# Patient Record
Sex: Female | Born: 1960
Health system: Southern US, Community
[De-identification: ages and names within clinical notes are randomized; demographics above are authoritative.]

## PROBLEM LIST (undated history)

## (undated) DIAGNOSIS — T8859XA Other complications of anesthesia, initial encounter: Secondary | ICD-10-CM

## (undated) DIAGNOSIS — R011 Cardiac murmur, unspecified: Secondary | ICD-10-CM

## (undated) DIAGNOSIS — G43909 Migraine, unspecified, not intractable, without status migrainosus: Secondary | ICD-10-CM

## (undated) DIAGNOSIS — T4145XA Adverse effect of unspecified anesthetic, initial encounter: Secondary | ICD-10-CM

## (undated) DIAGNOSIS — N135 Crossing vessel and stricture of ureter without hydronephrosis: Secondary | ICD-10-CM

## (undated) DIAGNOSIS — B009 Herpesviral infection, unspecified: Secondary | ICD-10-CM

## (undated) DIAGNOSIS — N879 Dysplasia of cervix uteri, unspecified: Secondary | ICD-10-CM

## (undated) DIAGNOSIS — J45909 Unspecified asthma, uncomplicated: Secondary | ICD-10-CM

## (undated) DIAGNOSIS — R112 Nausea with vomiting, unspecified: Secondary | ICD-10-CM

## (undated) DIAGNOSIS — Z9889 Other specified postprocedural states: Secondary | ICD-10-CM

## (undated) DIAGNOSIS — M8430XA Stress fracture, unspecified site, initial encounter for fracture: Secondary | ICD-10-CM

## (undated) HISTORY — DX: Unspecified asthma, uncomplicated: J45.909

## (undated) HISTORY — DX: Dysplasia of cervix uteri, unspecified: N87.9

## (undated) HISTORY — DX: Herpesviral infection, unspecified: B00.9

## (undated) HISTORY — DX: Crossing vessel and stricture of ureter without hydronephrosis: N13.5

## (undated) HISTORY — PX: OTHER SURGICAL HISTORY: SHX169

## (undated) HISTORY — DX: Stress fracture, unspecified site, initial encounter for fracture: M84.30XA

## (undated) HISTORY — DX: Migraine, unspecified, not intractable, without status migrainosus: G43.909

---

## 1989-08-02 HISTORY — PX: TMJ ARTHROSCOPY: SHX1067

## 1995-08-03 DIAGNOSIS — N879 Dysplasia of cervix uteri, unspecified: Secondary | ICD-10-CM

## 1995-08-03 HISTORY — PX: CERVICAL BIOPSY  W/ LOOP ELECTRODE EXCISION: SUR135

## 1995-08-03 HISTORY — DX: Dysplasia of cervix uteri, unspecified: N87.9

## 1999-12-07 ENCOUNTER — Other Ambulatory Visit: Admission: RE | Admit: 1999-12-07 | Discharge: 1999-12-07 | Payer: Self-pay | Admitting: Obstetrics and Gynecology

## 2000-08-01 ENCOUNTER — Ambulatory Visit (HOSPITAL_COMMUNITY): Admission: RE | Admit: 2000-08-01 | Discharge: 2000-08-01 | Payer: Self-pay | Admitting: Family Medicine

## 2000-08-01 ENCOUNTER — Encounter: Payer: Self-pay | Admitting: Family Medicine

## 2002-12-04 ENCOUNTER — Other Ambulatory Visit: Admission: RE | Admit: 2002-12-04 | Discharge: 2002-12-04 | Payer: Self-pay | Admitting: Obstetrics and Gynecology

## 2003-12-11 ENCOUNTER — Other Ambulatory Visit: Admission: RE | Admit: 2003-12-11 | Discharge: 2003-12-11 | Payer: Self-pay | Admitting: Obstetrics and Gynecology

## 2005-01-05 ENCOUNTER — Other Ambulatory Visit: Admission: RE | Admit: 2005-01-05 | Discharge: 2005-01-05 | Payer: Self-pay | Admitting: Obstetrics and Gynecology

## 2006-01-26 ENCOUNTER — Other Ambulatory Visit: Admission: RE | Admit: 2006-01-26 | Discharge: 2006-01-26 | Payer: Self-pay | Admitting: Obstetrics and Gynecology

## 2007-01-31 ENCOUNTER — Other Ambulatory Visit: Admission: RE | Admit: 2007-01-31 | Discharge: 2007-01-31 | Payer: Self-pay | Admitting: Obstetrics and Gynecology

## 2008-02-05 ENCOUNTER — Other Ambulatory Visit: Admission: RE | Admit: 2008-02-05 | Discharge: 2008-02-05 | Payer: Self-pay | Admitting: Gynecology

## 2009-02-20 ENCOUNTER — Ambulatory Visit: Payer: Self-pay | Admitting: Gynecology

## 2009-02-20 ENCOUNTER — Other Ambulatory Visit: Admission: RE | Admit: 2009-02-20 | Discharge: 2009-02-20 | Payer: Self-pay | Admitting: Gynecology

## 2009-02-20 ENCOUNTER — Encounter: Payer: Self-pay | Admitting: Gynecology

## 2009-03-05 ENCOUNTER — Ambulatory Visit: Payer: Self-pay | Admitting: Sports Medicine

## 2009-05-20 ENCOUNTER — Ambulatory Visit: Payer: Self-pay | Admitting: Gynecology

## 2009-06-18 ENCOUNTER — Ambulatory Visit: Payer: Self-pay | Admitting: Gynecology

## 2009-09-09 ENCOUNTER — Other Ambulatory Visit: Admission: RE | Admit: 2009-09-09 | Discharge: 2009-09-09 | Payer: Self-pay | Admitting: Gynecology

## 2009-09-09 ENCOUNTER — Ambulatory Visit: Payer: Self-pay | Admitting: Gynecology

## 2009-10-24 ENCOUNTER — Ambulatory Visit: Payer: Self-pay | Admitting: Gynecology

## 2009-10-27 ENCOUNTER — Ambulatory Visit: Payer: Self-pay | Admitting: Gynecology

## 2009-11-18 ENCOUNTER — Encounter: Admission: RE | Admit: 2009-11-18 | Discharge: 2009-11-18 | Payer: Self-pay | Admitting: Gastroenterology

## 2010-03-06 ENCOUNTER — Ambulatory Visit: Payer: Self-pay | Admitting: Gynecology

## 2010-03-06 ENCOUNTER — Other Ambulatory Visit: Admission: RE | Admit: 2010-03-06 | Discharge: 2010-03-06 | Payer: Self-pay | Admitting: Gynecology

## 2010-05-20 ENCOUNTER — Ambulatory Visit: Payer: Self-pay | Admitting: Sports Medicine

## 2010-05-20 DIAGNOSIS — S86819A Strain of other muscle(s) and tendon(s) at lower leg level, unspecified leg, initial encounter: Secondary | ICD-10-CM

## 2010-05-20 DIAGNOSIS — S838X9A Sprain of other specified parts of unspecified knee, initial encounter: Secondary | ICD-10-CM | POA: Insufficient documentation

## 2010-09-03 NOTE — Assessment & Plan Note (Signed)
Summary: KNEE PAIN,MC   Vital Signs:  Patient profile:   50 year old female Height:      64 inches Weight:      130 pounds BMI:     22.40 BP sitting:   139 / 75  Vitals Entered By: Lillia Pauls CMA (May 20, 2010 3:29 PM)  History of Present Illness: 50 yo F runner R knee pain, posterior thigh, buttock.  Gradually worsened over last 6 weeks. located on lateral posterior knee and runs up behind.  Occurs during run or the next day.  Icing and doing ibuprofen.  Usually runs 4 miles per session, now doing more 3-3.5 miles 2/2 pain.  Never swells.  maybe feels some weakness/instability, no catching/locking.  NKI.  Running about 15 miles per week. Uses green sports insoles. Feels getting better over last 2 weeks.  Physical Exam  General:  Well-developed,well-nourished,in no acute distress; alert,appropriate and cooperative throughout examination Msk:  R knee: no effusion, ligaments intact, neg McMurray's.  Mild ttp over pes anserine bursa.  Mild ttp over biceps femoris tendon and SM/ST hamstring tendons. Nl HS strength, no palpable defect in HS. No ttp over sciatic nerve  Hips: nl strength with abd/add, ER/IR   Impression & Recommendations:  Problem # 1:  MUSCLE STRAIN, HAMSTRING MUSCLE (ICD-844.8) Assessment Improved Really more strain of the tendons than the muscle, and appears to be improving - reassured pt no other bad path found in her knee - given handout on HS rehab exercises - gradually increase back her running - f/u prn   Orders Added: 1)  Est. Patient Level IV [16109]

## 2011-03-08 ENCOUNTER — Encounter: Payer: Self-pay | Admitting: Gynecology

## 2011-03-18 ENCOUNTER — Encounter: Payer: Self-pay | Admitting: Gynecology

## 2011-03-18 ENCOUNTER — Ambulatory Visit (INDEPENDENT_AMBULATORY_CARE_PROVIDER_SITE_OTHER): Payer: BC Managed Care – PPO | Admitting: Gynecology

## 2011-03-18 ENCOUNTER — Other Ambulatory Visit (HOSPITAL_COMMUNITY)
Admission: RE | Admit: 2011-03-18 | Discharge: 2011-03-18 | Disposition: A | Discharge status: Home / Self Care | Payer: BC Managed Care – PPO | Source: Ambulatory Visit | Attending: Gynecology | Admitting: Gynecology

## 2011-03-18 VITALS — BP 116/70 | Ht 64.0 in | Wt 138.0 lb

## 2011-03-18 DIAGNOSIS — Z131 Encounter for screening for diabetes mellitus: Secondary | ICD-10-CM

## 2011-03-18 DIAGNOSIS — Z01419 Encounter for gynecological examination (general) (routine) without abnormal findings: Secondary | ICD-10-CM | POA: Insufficient documentation

## 2011-03-18 DIAGNOSIS — R011 Cardiac murmur, unspecified: Secondary | ICD-10-CM

## 2011-03-18 DIAGNOSIS — G43909 Migraine, unspecified, not intractable, without status migrainosus: Secondary | ICD-10-CM | POA: Insufficient documentation

## 2011-03-18 DIAGNOSIS — Z13220 Encounter for screening for lipoid disorders: Secondary | ICD-10-CM

## 2011-03-18 NOTE — Progress Notes (Addendum)
Anne Mann 1960-11-09 [id number]        50 y.o.  for annual exam.  Has Mirena IUD with scant menses.  Past medical history,surgical history, allergies, family history and social history were all reviewed and documented in the EPIC chart. ROS:  Was performed and pertinent positives and negatives are included in the history.  Exam: chaperone present Filed Vitals:   03/18/11 1439  BP: 116/70   General appearance  Normal Skin grossly normal Head/Neck normal with no cervical or supraclavicular adenopathy thyroid normal Lungs  clear Cardiac RR, with a 1/6 systolic ejection murmur, no rubs or gallops Abdominal  soft, nontender, without masses, organomegaly or hernia Breasts  examined lying and sitting without masses, retractions, discharge or axillary adenopathy. Pelvic  Ext/BUS/vagina  normal   Cervix  normal  Pap done IUD string visualized  Uterus  anteverted, normal size, shape and contour, midline and mobile nontender   Adnexa  Without masses or tenderness    Anus and perineum  normal   Rectovaginal  normal sphincter tone without palpated masses or tenderness.    Assessment/Plan:  50 y.o. female for annual exam.    #1 Heart murmur. Patient has subtle heart murmur. I never appreciated this before. She notes as a child being told that she had a murmur. Recommended that she see cardiology let them listen, possible echo at their discretion and she agrees with this and we'll make these arrangements. #2 IUD. Patient has Mirena IUD placed October 2010 good through 2015. She's doing well with this. IUD string visualized today. #3 History HSV. Patient stopped suppressive therapy last year has not had any outbreaks since then. We'll continue to monitor and if needed treat intermittently. #4 Health maintenance self breast exams on a monthly basis discussed encouraged. Has mammo-due in October and I reminded her to schedule this. She had a colonoscopy last year due to some irritable bowel type  symptoms and was told was normal.   [name] MD, 3:13 PM 03/18/2011

## 2011-09-06 ENCOUNTER — Encounter: Payer: Self-pay | Admitting: Gynecology

## 2012-02-09 ENCOUNTER — Ambulatory Visit
Admission: RE | Admit: 2012-02-09 | Discharge: 2012-02-09 | Disposition: A | Discharge status: Home / Self Care | Payer: BC Managed Care – PPO | Source: Ambulatory Visit | Attending: Sports Medicine | Admitting: Sports Medicine

## 2012-02-09 ENCOUNTER — Ambulatory Visit (INDEPENDENT_AMBULATORY_CARE_PROVIDER_SITE_OTHER): Payer: BC Managed Care – PPO | Admitting: Sports Medicine

## 2012-02-09 VITALS — BP 120/70 | Ht 64.0 in | Wt 130.0 lb

## 2012-02-09 DIAGNOSIS — M79669 Pain in unspecified lower leg: Secondary | ICD-10-CM

## 2012-02-09 DIAGNOSIS — M79609 Pain in unspecified limb: Secondary | ICD-10-CM

## 2012-02-09 NOTE — Addendum Note (Signed)
Addended by: Annita Brod on: 02/09/2012 02:54 PM   Modules accepted: Orders

## 2012-02-09 NOTE — Progress Notes (Signed)
  Subjective:    Patient ID: Anne Mann, female    DOB: 10-15-1960, 51 y.o.   MRN: [id number]  HPI Pt that comes today to [facility] clinic for the first time. She is a current runner 12-15 mpw that complains about pain on her right shin for about a year. The pain is located on 1/3 proximal portion on the lateral side of tibia. Pain is described as nagging and radiates to mid aspect of tibia. Pain is present when running and at rest, and more noticeable at night. Denies tingling, numbness or weakness of right LE. She has taken ibuprofen and ice during exacerbations with no absolute relieve of symptoms.   Review of Systems Per HPI    Objective:   Physical Exam Right lower extremity: Tender to palpation along the proximal tibia most pronounced along the medial tibial border. Mildly positive hop test. Slight tenderness with percussion directly over the tibia. No soft tissue swelling. Compartments are soft. Is elevation of the knee shows full motion without an effusion. She is neurovascular intact distally and walking without a limp.  Muscle skeletal ultrasound: Ultrasound evaluation of the right lower leg was performed. Images were obtained both in the longitudinal and transverse views. There is a suggestion of cortical irregularity in the proximal tibia with increased neovascularization concerning for possible stress fracture, but this is certainly not definitive.   Assessment & Plan:  Right lower leg pain-rule out proximal tibial stress fracture  Two-view x-ray of the right tib-fib were performed. No obvious stress fracture. Given the chronicity of her symptoms and findings on ultrasound I think we should pursue an MRI scan for definitive diagnosis. I will call the patient was I reviewed those results at which point we'll delineate further treatment.

## 2012-02-11 ENCOUNTER — Ambulatory Visit
Admission: RE | Admit: 2012-02-11 | Discharge: 2012-02-11 | Disposition: A | Discharge status: Home / Self Care | Payer: BC Managed Care – PPO | Source: Ambulatory Visit | Attending: Sports Medicine | Admitting: Sports Medicine

## 2012-02-11 DIAGNOSIS — M79669 Pain in unspecified lower leg: Secondary | ICD-10-CM

## 2012-02-28 ENCOUNTER — Ambulatory Visit (INDEPENDENT_AMBULATORY_CARE_PROVIDER_SITE_OTHER): Payer: BC Managed Care – PPO | Admitting: Sports Medicine

## 2012-02-28 VITALS — BP 111/72

## 2012-02-28 DIAGNOSIS — M84369A Stress fracture, unspecified tibia and fibula, initial encounter for fracture: Secondary | ICD-10-CM

## 2012-02-28 NOTE — Progress Notes (Signed)
  Subjective:    Patient ID: Anne Mann, female    DOB: [birth year], 51 y.o.   MRN: [id number]  HPI chief complaint: Followup on right shin pain  Patient comes in today for followup. Recent MRI scan of her right lower extremity showed no evidence of stress fracture. She has a mild focal area of a possible stress reaction but no discrete cortical fracture seen. Symptoms have been present intermittently for 12 month. Pain is most noticeable at the end of her run. Minimal pain with walking.    Review of Systems     Objective:   Physical Exam  Well-developed, well-nourished. No acute distress Right lower extremity still shows tenderness to palpation along the proximal to mid medial tibial border. No soft tissue swell. She is neurovascular intact distally Evaluation of her feet show well-preserved transverse and longitudinal archs. Evaluation of her running gait shows squinting patella. She has marked weakness with resisted hip abduction.  MRI is as above     Assessment & Plan:  1. Chronic right lower leg pain secondary to mild tibial stress reaction  Were going to try a body helix Sleeve for compression while running. She is shown a complete set of hip strengthening exercise and will followup in 4 weeks. I think she can continue running using pain as her guide. Call questions or concerns prior to her followup visit.

## 2012-02-28 NOTE — Progress Notes (Deleted)
  Subjective:    Patient ID: Anne Mann, female    DOB: 1961/03/07, 51 y.o.   MRN: [id number]  HPI chief complaint: Followup on right arch pain  Patient comes in today for followup. She is 95% improved with sports insoles and navicular pads. Still not doing line drills as she has just really started running again. Topical anti-inflammatory seems to be quite helpful. She will be returning to [place] state in two-weeks. She is here today with her mom.    Review of Systems     Objective:   Physical Exam  Well-developed, well-nourished. No acute distress Right foot shows no tenderness to palpation, including along the posterior tibialis tendon. No soft tissue swelling. She is walking without a limp      Assessment & Plan:  #1. Improved right foot pain secondary to mild posterior tibialis tendinitis  Patient is shown eccentric heel drops focusing on rehabilitation of the posterior tibialis tendon. She can begin to increase her mileage with her sports insoles and place. Continue with topical anti-inflammatory as needed. She may at some point need custom orthotics but for now she is happy with the sports insoles. Followup when necessary.

## 2012-03-20 ENCOUNTER — Encounter: Payer: Self-pay | Admitting: Gynecology

## 2012-03-20 ENCOUNTER — Ambulatory Visit (INDEPENDENT_AMBULATORY_CARE_PROVIDER_SITE_OTHER): Payer: BC Managed Care – PPO | Admitting: Gynecology

## 2012-03-20 ENCOUNTER — Other Ambulatory Visit (HOSPITAL_COMMUNITY)
Admission: RE | Admit: 2012-03-20 | Discharge: 2012-03-20 | Disposition: A | Discharge status: Home / Self Care | Payer: BC Managed Care – PPO | Source: Ambulatory Visit | Attending: Gynecology | Admitting: Gynecology

## 2012-03-20 VITALS — BP 112/60 | Ht 64.0 in | Wt 136.0 lb

## 2012-03-20 DIAGNOSIS — Z30431 Encounter for routine checking of intrauterine contraceptive device: Secondary | ICD-10-CM

## 2012-03-20 DIAGNOSIS — Z01419 Encounter for gynecological examination (general) (routine) without abnormal findings: Secondary | ICD-10-CM

## 2012-03-20 DIAGNOSIS — Z13220 Encounter for screening for lipoid disorders: Secondary | ICD-10-CM

## 2012-03-20 DIAGNOSIS — Z131 Encounter for screening for diabetes mellitus: Secondary | ICD-10-CM

## 2012-03-20 DIAGNOSIS — Z1151 Encounter for screening for human papillomavirus (HPV): Secondary | ICD-10-CM | POA: Insufficient documentation

## 2012-03-20 DIAGNOSIS — N951 Menopausal and female climacteric states: Secondary | ICD-10-CM

## 2012-03-20 LAB — CBC WITH DIFFERENTIAL/PLATELET
Basophils Absolute: 0 10*3/uL (ref 0.0–0.1)
Lymphocytes Relative: 26 % (ref 12–46)
Lymphs Abs: 2.4 10*3/uL (ref 0.7–4.0)
Neutro Abs: 5.7 10*3/uL (ref 1.7–7.7)
Neutrophils Relative %: 64 % (ref 43–77)
Platelets: 218 10*3/uL (ref 150–400)
RBC: 4.51 MIL/uL (ref 3.87–5.11)
WBC: 9.1 10*3/uL (ref 4.0–10.5)

## 2012-03-20 LAB — LIPID PANEL
Cholesterol: 171 mg/dL (ref 0–200)
Triglycerides: 118 mg/dL (ref <150)

## 2012-03-20 LAB — FOLLICLE STIMULATING HORMONE: FSH: 96.6 m[IU]/mL

## 2012-03-20 NOTE — Patient Instructions (Signed)
Follow up for lab work. Follow up in one year for annual gynecologic exam.

## 2012-03-20 NOTE — Addendum Note (Signed)
Addended by: Dayna Barker on: 03/20/2012 04:43 PM   Modules accepted: Orders

## 2012-03-20 NOTE — Progress Notes (Signed)
Anne Mann [birth year] [id number]        51 y.o.  [id number] for annual exam.  Several issues noted below.  Past medical history,surgical history, medications, allergies, family history and social history were all reviewed and documented in the EPIC chart. ROS:  Was performed and pertinent positives and negatives are included in the history.  Exam: [name] assistant Filed Vitals:   03/20/12 1559  BP: 112/60  Height: 5\' 4"  (1.626 m)  Weight: 136 lb (61.689 kg)   General appearance  Normal Skin grossly normal Head/Neck normal with no cervical or supraclavicular adenopathy thyroid normal Lungs  clear Cardiac RR, without RMG Abdominal  soft, nontender, without masses, organomegaly or hernia Breasts  examined lying and sitting without masses, retractions, discharge or axillary adenopathy. Pelvic  Ext/BUS/vagina  normal   Cervix  normal IUD string visualized, Pap/HPV  Uterus  axial, normal size, shape and contour, midline and mobile nontender   Adnexa  Without masses or tenderness    Anus and perineum  normal   Rectovaginal  normal sphincter tone without palpated masses or tenderness.    Assessment/Plan:  51 y.o. [id number] female for annual exam.   1. Mirena IUD.  Placed 05/2009. Doing well with scant to absent menses. We'll continue to monitor. 2. Hot flushes. Patient does note hot flushes over the past year coming and going. Lasting 4 minutes. Will check FSH. Mother does have history of premature menopause in the early 91s. 3. Mammogram.  Patient had mammogram 09/2011. We'll continue with annual mammography. SBE monthly reviewed. 4. Pap smear. History of dysplasia status post LEEP 1997 showing focus of mild dysplasia. Pap smears have been normal since then. Last Pap smear 2011. Pap/HPV done today. If negative plan less frequent screening. 5. Health maintenance. Baseline CBC lipid profile glucose urinalysis ordered along with FSH. Follow up one year, sooner as needed.    [name]  MD, 4:33 PM 03/20/2012

## 2012-03-21 ENCOUNTER — Encounter: Payer: Self-pay | Admitting: Gynecology

## 2012-03-21 LAB — URINALYSIS W MICROSCOPIC + REFLEX CULTURE
Bacteria, UA: NONE SEEN
Bilirubin Urine: NEGATIVE
Casts: NONE SEEN
Crystals: NONE SEEN
Ketones, ur: NEGATIVE mg/dL
Nitrite: NEGATIVE
Specific Gravity, Urine: <1.005 — ABNORMAL LOW (ref 1.005–1.030)
Squamous Epithelial / LPF: NONE SEEN
pH: 6.5 (ref 5.0–8.0)

## 2012-03-27 ENCOUNTER — Ambulatory Visit (INDEPENDENT_AMBULATORY_CARE_PROVIDER_SITE_OTHER): Payer: BC Managed Care – PPO | Admitting: Sports Medicine

## 2012-03-27 VITALS — BP 121/75 | Ht 64.0 in | Wt 130.0 lb

## 2012-03-27 DIAGNOSIS — IMO0002 Reserved for concepts with insufficient information to code with codable children: Secondary | ICD-10-CM

## 2012-03-27 DIAGNOSIS — S86899A Other injury of other muscle(s) and tendon(s) at lower leg level, unspecified leg, initial encounter: Secondary | ICD-10-CM

## 2012-03-27 NOTE — Progress Notes (Signed)
  Subjective:    Patient ID: Anne Mann, female    DOB: 1961-07-24, 51 y.o.   MRN: [id number]  HPI patient comes in today for followup on right lower leg pain. Overall pain has improved. She has not been running for about a week do to a URI. Recent MRI scan showed small focal area of edema in the proximal tibia consistent with a mild stress reaction but no stress fracture. She has noted benefit from her compression sleeve. She has also been compliant with her home exercises in an effort to strengthen her hips. She also has a pair of green sports insoles which she states are quite comfortable.    Review of Systems     Objective:   Physical Exam Well-developed, well-nourished. No acute distress. Awake alert and oriented x3  Right lower leg: Still a slight tenderness to palpation along the proximal medial tibial border. No soft tissue swelling. No pain to percussion over the tibia. She has much better strength with resisted hip abduction than on her previous visit, but still has some weakness. Neurovascularly intact distally. Walks without a limp.       Assessment & Plan:  1. Improving right lower leg pain secondary to medial tibial stress syndrome   Patient has made good improvement with hip abductor strengthening. We will add heel walk and toe walks to her home exercise program. She will continue with her sports insoles and compression sleeve with running. I recommended that she try to run some on a soft surface such as a soft track or on a treadmill (she normally runs on the road). She can increase activity as tolerated and followup when necessary.

## 2012-04-11 ENCOUNTER — Encounter: Payer: Self-pay | Admitting: Gynecology

## 2012-04-11 ENCOUNTER — Ambulatory Visit (INDEPENDENT_AMBULATORY_CARE_PROVIDER_SITE_OTHER): Payer: BC Managed Care – PPO | Admitting: Gynecology

## 2012-04-11 DIAGNOSIS — N951 Menopausal and female climacteric states: Secondary | ICD-10-CM

## 2012-04-11 DIAGNOSIS — Z79890 Hormone replacement therapy (postmenopausal): Secondary | ICD-10-CM

## 2012-04-11 MED ORDER — ESTRADIOL 0.05 MG/24HR TD PTTW: 1.0000 | MEDICATED_PATCH | TRANSDERMAL | Status: DC

## 2012-04-11 NOTE — Progress Notes (Signed)
Patient presents to discuss elevated FSH of 96. She is having symptoms to include hot flashes night sweats some sleep disturbance. Has Mirena IUD in place. Amenorrheic. I reviewed options to include observation, nonhormonal such as soy based, nonhormonal pharmacologic such as Effexor and HRT. I discussed the WHI study with increased risks of stroke heart attack DVT and breast cancer. I discussed the first pass effect and benefits of transdermal over oral. I reviewed the ACOG and NAMS statements of lowest dose for shortest period of time. After a lengthy discussion she wants to try HRT. I reviewed the issue of progesterone protection of the uterus against unopposed estrogen. She does have a Mirena IUD and the offbrand issues of protection with this reviewed. Options of oral progesterone versus using the Mirena IUD was discussed and we both agree on Mirena IUD at present. Will start with MiniVivelle 0.05 patches 2 weeks sample refill x1 year. Report any bleeding.  Initiate some relief but not totally sure call and I may increase her to 0.075. Otherwise if she does well then she'll continue through next year. She has any issues or questions she will call me.

## 2012-04-11 NOTE — Patient Instructions (Signed)

## 2012-09-11 ENCOUNTER — Encounter: Payer: Self-pay | Admitting: Gynecology

## 2012-09-12 ENCOUNTER — Other Ambulatory Visit: Payer: Self-pay | Admitting: *Deleted

## 2012-09-12 DIAGNOSIS — R928 Other abnormal and inconclusive findings on diagnostic imaging of breast: Secondary | ICD-10-CM

## 2012-09-13 ENCOUNTER — Other Ambulatory Visit: Payer: Self-pay | Admitting: Gynecology

## 2012-09-13 DIAGNOSIS — R928 Other abnormal and inconclusive findings on diagnostic imaging of breast: Secondary | ICD-10-CM

## 2012-11-29 ENCOUNTER — Telehealth: Payer: Self-pay | Admitting: *Deleted

## 2012-11-29 MED ORDER — ESTRADIOL 0.075 MG/24HR TD PTTW: 1.0000 | MEDICATED_PATCH | TRANSDERMAL | Status: DC

## 2012-11-29 NOTE — Telephone Encounter (Signed)
Recommend increasing to 0.075 mg patch refilled through next annual. Call if continued hot flashes

## 2012-11-29 NOTE — Telephone Encounter (Signed)
Left the below note on voicemail, rx sent.

## 2012-11-29 NOTE — Telephone Encounter (Signed)
Pt is currently taking minivelle dot patch 0.05mg  and c/o that hot flashed has returned for about 3 weeks she has been feeling this. Please advise

## 2013-03-22 ENCOUNTER — Ambulatory Visit (INDEPENDENT_AMBULATORY_CARE_PROVIDER_SITE_OTHER): Payer: BC Managed Care – PPO | Admitting: Gynecology

## 2013-03-22 ENCOUNTER — Encounter: Payer: Self-pay | Admitting: Gynecology

## 2013-03-22 VITALS — BP 110/66 | Ht 64.0 in | Wt 138.0 lb

## 2013-03-22 DIAGNOSIS — Z01419 Encounter for gynecological examination (general) (routine) without abnormal findings: Secondary | ICD-10-CM

## 2013-03-22 DIAGNOSIS — Z30431 Encounter for routine checking of intrauterine contraceptive device: Secondary | ICD-10-CM

## 2013-03-22 DIAGNOSIS — Z13220 Encounter for screening for lipoid disorders: Secondary | ICD-10-CM

## 2013-03-22 DIAGNOSIS — Z79890 Hormone replacement therapy (postmenopausal): Secondary | ICD-10-CM

## 2013-03-22 LAB — CBC WITH DIFFERENTIAL/PLATELET
Basophils Absolute: 0 10*3/uL (ref 0.0–0.1)
Basophils Relative: 1 % (ref 0–1)
Eosinophils Absolute: 0.3 10*3/uL (ref 0.0–0.7)
HCT: 41.8 % (ref 36.0–46.0)
Hemoglobin: 13.7 g/dL (ref 12.0–15.0)
MCH: 28.6 pg (ref 26.0–34.0)
MCHC: 32.8 g/dL (ref 30.0–36.0)
Monocytes Absolute: 0.5 10*3/uL (ref 0.1–1.0)
Monocytes Relative: 8 % (ref 3–12)
Neutro Abs: 2.4 10*3/uL (ref 1.7–7.7)
RDW: 14.4 % (ref 11.5–15.5)

## 2013-03-22 MED ORDER — ESTRADIOL 0.075 MG/24HR TD PTTW: 1.0000 | MEDICATED_PATCH | TRANSDERMAL | Status: DC

## 2013-03-22 NOTE — Progress Notes (Signed)
Anne Mann Oct 24, 1960 [id number]        52 y.o.  [id number] for annual exam.  Several issues noted below.  Past medical history,surgical history, medications, allergies, family history and social history were all reviewed and documented in the EPIC chart.  ROS:  Performed and pertinent positives and negatives are included in the history, assessment and plan .  Exam: [name] assistant Filed Vitals:   03/22/13 1529  BP: 110/66  Height: 5\' 4"  (1.626 m)  Weight: 138 lb (62.596 kg)   General appearance  Normal Skin grossly normal Head/Neck normal with no cervical or supraclavicular adenopathy thyroid normal Lungs  clear Cardiac RR, without RMG Abdominal  soft, nontender, without masses, organomegaly or hernia Breasts  examined lying and sitting without masses, retractions, discharge or axillary adenopathy. Pelvic  Ext/BUS/vagina  normal  Cervix  normal with IUD string visualized  Uterus  axial, normal size, shape and contour, midline and mobile nontender   Adnexa  Without masses or tenderness    Anus and perineum  normal   Rectovaginal  normal sphincter tone without palpated masses or tenderness.    Assessment/Plan:  52 y.o. [id number] female for annual exam.   1. Postmenopausal/HRT. Patient started on Vivelle 0.075 patches last year and is doing great with no significant menopausal symptoms. Using her Mirena IUD for endometrial protection.  I again reviewed the whole issue of HRT with her to include the WHI study with increased risk of stroke, heart attack, DVT and breast cancer. The ACOG and NAMS statements for lowest dose for the shortest period of time reviewed. Transdermal versus oral first-pass effect benefit discussed.  I also reviewed offbrand labeling use of Mirena for endometrial protection and she is comfortable with this. Due to have this replaced next year. Continues without menses. 2. Mirena IUD 05/2009. Due to be replaced next year. Strang visualized. 3. Pap smear/HPV 2013. No  Pap smear done today. 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 Plan repeat in 3-5 your interval. 4. Mammography 09/2012. Continue with annual mammography. SBE monthly review. 5. Colonoscopy 2011. Repeat at their recommended interval. 6. DEXA screening. We'll plan further into the menopause. Increase calcium vitamin D reviewed. Check vitamin D level today. 7. Health maintenance. Baseline CBC comprehensive metabolic panel lipid profile TSH urinalysis vitamin D ordered. Followup one year, sooner as needed.  Note: This document was prepared with digital dictation and possible smart phrase technology. Any transcriptional errors that result from this process are unintentional.   [name] MD, 4:39 PM 03/22/2013

## 2013-03-22 NOTE — Patient Instructions (Signed)
Follow up in one year, sooner as needed. 

## 2013-03-23 LAB — LIPID PANEL
HDL: 66 mg/dL (ref >39)
LDL Cholesterol: 128 mg/dL — ABNORMAL HIGH (ref 0–99)
Total CHOL/HDL Ratio: 3.1 Ratio
Triglycerides: 58 mg/dL (ref <150)
VLDL: 12 mg/dL (ref 0–40)

## 2013-03-23 LAB — URINALYSIS W MICROSCOPIC + REFLEX CULTURE
Bacteria, UA: NONE SEEN
Casts: NONE SEEN
Hgb urine dipstick: NEGATIVE
Ketones, ur: NEGATIVE mg/dL
Leukocytes, UA: NEGATIVE
Nitrite: NEGATIVE
Protein, ur: NEGATIVE mg/dL
pH: 7.5 (ref 5.0–8.0)

## 2013-03-23 LAB — VITAMIN D 25 HYDROXY (VIT D DEFICIENCY, FRACTURES): Vit D, 25-Hydroxy: 43 ng/mL (ref 30–89)

## 2013-03-23 LAB — COMPREHENSIVE METABOLIC PANEL
AST: 18 U/L (ref 0–37)
Alkaline Phosphatase: 62 U/L (ref 39–117)
BUN: 14 mg/dL (ref 6–23)
Creat: 0.85 mg/dL (ref 0.50–1.10)
Glucose, Bld: 50 mg/dL — ABNORMAL LOW (ref 70–99)
Potassium: 4.4 mEq/L (ref 3.5–5.3)
Total Bilirubin: 0.4 mg/dL (ref 0.3–1.2)

## 2013-03-23 LAB — TSH: TSH: 1.3 u[IU]/mL (ref 0.350–4.500)

## 2013-06-27 ENCOUNTER — Telehealth: Payer: Self-pay | Admitting: *Deleted

## 2013-06-27 MED ORDER — VALACYCLOVIR HCL 500 MG PO TABS: ORAL_TABLET | ORAL | Status: DC

## 2013-06-27 NOTE — Telephone Encounter (Signed)
Pt called c/o HSV outbreak per note on 03/18/11 pt has History HSV. rx will be sent to pharmacy, pt informed.

## 2013-10-17 ENCOUNTER — Encounter: Payer: Self-pay | Admitting: Gynecology

## 2013-11-22 ENCOUNTER — Ambulatory Visit (INDEPENDENT_AMBULATORY_CARE_PROVIDER_SITE_OTHER): Payer: BC Managed Care – PPO | Admitting: Sports Medicine

## 2013-11-22 ENCOUNTER — Ambulatory Visit
Admission: RE | Admit: 2013-11-22 | Discharge: 2013-11-22 | Disposition: A | Discharge status: Home / Self Care | Payer: BC Managed Care – PPO | Source: Ambulatory Visit | Attending: Sports Medicine | Admitting: Sports Medicine

## 2013-11-22 ENCOUNTER — Encounter: Payer: Self-pay | Admitting: Sports Medicine

## 2013-11-22 VITALS — BP 119/76 | Ht 64.0 in | Wt 135.0 lb

## 2013-11-22 DIAGNOSIS — M84369A Stress fracture, unspecified tibia and fibula, initial encounter for fracture: Secondary | ICD-10-CM

## 2013-11-22 DIAGNOSIS — M25569 Pain in unspecified knee: Secondary | ICD-10-CM

## 2013-11-23 NOTE — Progress Notes (Signed)
   Subjective:    Patient ID: Anne Mann, female    DOB: 07/04/1961, 53 y.o.   MRN: [id number]  HPI chief complaint: Right lower leg pain and right heel pain  Patient comes in today with persistent right lower leg pain. She was last seen in the office in August of 2013 with a similar complaint. An MRI of her right lower extremity done in July of 2013 showed a small focal stress reaction in the proximal right tibial diaphysis with no evidence of cortical fracture. At her last followup she had noticed some improvement with compression and a home exercise program. We also gave her green sports insoles. Since that last office visit however, she tells me that her symptoms persist. She tried to train for a half marathon but as she increased her mileage she began to experience increasing pain in the same area that she had pain previously. She localizes the pain to the mid tibia. She has not noticed any swelling. No associated numbness or tingling. Some mild pain with walking but most of her pain is with running. She is also complaining of 2 weeks of heel pain which she localizes to the plantar aspect of her heel. Again, no numbness or tingling. She does have a history of prior plantar fasciitis. She has been using ice which does help some.  Interim medical history reviewed Current medications review Allergies reviewed    Review of Systems As above    Objective:   Physical Exam Well-developed, well-nourished. No acute distress. Awake alert and oriented x3. Vital signs are reviewed.  Right lower leg: There is mild tenderness to palpation and percussion directly over the mid tibia. No soft tissue swelling. There is pain with hop testing. No tenderness to palpation along the medial tibial border. Right heel: Tenderness to palpation at the calcaneal insertion of the plantar fascia. Negative calcaneal squeeze. Cavus foot. Neurovascularly intact distally.  Patient is able to run without a limp. She  pronates bilaterally.  X-rays including AP and lateral views of the right tib-fib are obtained. They're unremarkable. Specifically I see no evidence of mid tibial stress fracture. Previous MRI from July 2013 is as above       Assessment & Plan:  1. Chronic right lower leg pain secondary to mid tibial stress reaction 2. Right foot pain secondary to plantar fasciitis  Since the patient has had mid tibial pain for a total of about 3 years now, I would expect to see some cortical irregularity on the plain films if she had developed a stress fracture. Therefore, I think x-rays are reassuring but given the location of her persistent symptoms I've recommended a prolonged rest period of 3 months. I want her to wear a long Aircast while walking until pain free. I will see her back in the office in 6 weeks for reevaluation. After 3 months of relative rest (patient is given permission to bike and swim) we will consider a return to running protocol with the Aircast. Prior to running, we will strongly consider making her some custom orthotics. For her plantar fasciitis I have given her some plantar fascial stretches. She will continue icing as well.

## 2013-12-31 ENCOUNTER — Encounter: Payer: Self-pay | Admitting: Sports Medicine

## 2013-12-31 ENCOUNTER — Ambulatory Visit (INDEPENDENT_AMBULATORY_CARE_PROVIDER_SITE_OTHER): Payer: BC Managed Care – PPO | Admitting: Sports Medicine

## 2013-12-31 VITALS — BP 112/72 | Ht 64.0 in | Wt 135.0 lb

## 2013-12-31 DIAGNOSIS — M84369A Stress fracture, unspecified tibia and fibula, initial encounter for fracture: Secondary | ICD-10-CM

## 2013-12-31 DIAGNOSIS — M25569 Pain in unspecified knee: Secondary | ICD-10-CM

## 2013-12-31 NOTE — Patient Instructions (Signed)
2000 mg daily of calcium  800 international units of vitamin D daily

## 2013-12-31 NOTE — Progress Notes (Addendum)
   Subjective:    Patient ID: Anne Mann, female    DOB: 1961-03-20, 53 y.o.   MRN: [id number]  HPI Patient comes in today for followup on her right lower leg pain. Her pain has improved but not resolved. She states that she is 60-70% better. She continues to localize it to the mid tibia. X-rays done 6 weeks ago showed no evidence of stress fracture. She has been wearing her Aircast which has been helpful. However, still getting some mild pain with walking. No swelling. She has been exercising on a stationary bike without any problem. She is taking calcium and vitamin D but is unsure of the dose. Her plantar fascial pain has resolved.    Review of Systems     Objective:   Physical Exam Well-developed, well-nourished. No acute distress. Awake alert and oriented x3.  Right lower leg: There is still mild tenderness to palpation and percussion over the mid tibia. No soft tissue swelling. No tenderness to palpation along the medial tibial border. Neurovascularly intact distally. Walking without a limp.       Assessment & Plan:  Right lower leg pain secondary to mid tibial stress reaction Resolved plantar fasciitis  Patient will continue with her Aircast with walking until pain free. She is okay to continue exercising on a stationary bike. Followup with me in 6 weeks. Although there is no evidence of stress fracture on her x-ray I think we need to be cautious given her location of pain. I've explained to the patient that it may be several more weeks or months before she is cleared to run. As long as she continues to improve I don't think that we need to pursue further diagnostic imaging (a previous MRI in July 2013 showed only a mild stress reaction). However, if her improvement plateaus or pain worsens I may consider merits of a triple phase bone scan. She will make sure that she is taking 2000 mg of calcium and 800 international units of vitamin D daily.

## 2014-01-07 ENCOUNTER — Ambulatory Visit: Payer: BC Managed Care – PPO | Admitting: Sports Medicine

## 2014-02-11 ENCOUNTER — Ambulatory Visit (INDEPENDENT_AMBULATORY_CARE_PROVIDER_SITE_OTHER): Payer: BC Managed Care – PPO | Admitting: Sports Medicine

## 2014-02-11 ENCOUNTER — Encounter: Payer: Self-pay | Admitting: Sports Medicine

## 2014-02-11 VITALS — BP 120/79 | HR 69 | Ht 64.0 in | Wt 135.0 lb

## 2014-02-11 DIAGNOSIS — M25561 Pain in right knee: Secondary | ICD-10-CM

## 2014-02-11 DIAGNOSIS — M25569 Pain in unspecified knee: Secondary | ICD-10-CM

## 2014-02-11 NOTE — Progress Notes (Signed)
   Subjective:    Patient ID: Anne Mann, female    DOB: 06/03/1961, 53 y.o.   MRN: [id number]  HPI Patient comes in today for followup on her right tibial stress reaction. Overall, she feels like her pain has improved although not completely resolved. She is still wearing her long Aircast intermittently. She is cycling but continues to refrain from running. She is asking about the possibility of water aerobics.    Review of Systems     Objective:   Physical Exam  Well-developed, well-nourished. No acute distress. Awake alert and oriented x3.  Right lower leg: There still tenderness to palpation over the mid tibia. No soft tissue swelling. Very minimal discomfort with hop testing. Neurovascularly intact distally. Walks without a limp.      Assessment & Plan:  Persistent right lower leg pain possibly secondary to occult stress fracture  Overall, the patient feels like her symptoms are gradually improving. Recent x-rays showed no evidence of a stress fracture. We will continue with our current treatment in the form of long Aircast as needed and no running. I think she is okay to try water aerobics. Followup in 6 weeks. I've explained to her that if her improvement plateaus or symptoms worsen I would consider a triple phase bone scan to definitively rule out a stress fracture.

## 2014-03-26 ENCOUNTER — Ambulatory Visit (INDEPENDENT_AMBULATORY_CARE_PROVIDER_SITE_OTHER): Payer: BC Managed Care – PPO | Admitting: Gynecology

## 2014-03-26 ENCOUNTER — Encounter: Payer: Self-pay | Admitting: Gynecology

## 2014-03-26 ENCOUNTER — Ambulatory Visit (INDEPENDENT_AMBULATORY_CARE_PROVIDER_SITE_OTHER): Payer: BC Managed Care – PPO | Admitting: Sports Medicine

## 2014-03-26 VITALS — BP 116/76 | Ht 64.5 in | Wt 147.0 lb

## 2014-03-26 VITALS — BP 128/85 | Ht 64.0 in | Wt 135.0 lb

## 2014-03-26 DIAGNOSIS — Z79890 Hormone replacement therapy (postmenopausal): Secondary | ICD-10-CM

## 2014-03-26 DIAGNOSIS — M79604 Pain in right leg: Secondary | ICD-10-CM

## 2014-03-26 DIAGNOSIS — Z01419 Encounter for gynecological examination (general) (routine) without abnormal findings: Secondary | ICD-10-CM

## 2014-03-26 DIAGNOSIS — M79609 Pain in unspecified limb: Secondary | ICD-10-CM

## 2014-03-26 LAB — COMPREHENSIVE METABOLIC PANEL WITH GFR
ALT: 20 U/L (ref 0–35)
AST: 17 U/L (ref 0–37)
Albumin: 4.4 g/dL (ref 3.5–5.2)
Alkaline Phosphatase: 51 U/L (ref 39–117)
BUN: 10 mg/dL (ref 6–23)
CO2: 30 meq/L (ref 19–32)
Calcium: 9.8 mg/dL (ref 8.4–10.5)
Chloride: 103 meq/L (ref 96–112)
Creat: 0.83 mg/dL (ref 0.50–1.10)
Glucose, Bld: 86 mg/dL (ref 70–99)
Potassium: 4 meq/L (ref 3.5–5.3)
Sodium: 137 meq/L (ref 135–145)
Total Bilirubin: 0.5 mg/dL (ref 0.2–1.2)
Total Protein: 6.8 g/dL (ref 6.0–8.3)

## 2014-03-26 LAB — CBC WITH DIFFERENTIAL/PLATELET
BASOS ABS: 0 10*3/uL (ref 0.0–0.1)
Basophils Relative: 1 % (ref 0–1)
EOS PCT: 6 % — AB (ref 0–5)
Eosinophils Absolute: 0.2 10*3/uL (ref 0.0–0.7)
HEMATOCRIT: 39.7 % (ref 36.0–46.0)
Hemoglobin: 13.5 g/dL (ref 12.0–15.0)
LYMPHS PCT: 46 % (ref 12–46)
Lymphs Abs: 1.8 10*3/uL (ref 0.7–4.0)
MCH: 29.2 pg (ref 26.0–34.0)
MCHC: 34 g/dL (ref 30.0–36.0)
MCV: 85.7 fL (ref 78.0–100.0)
MONO ABS: 0.3 10*3/uL (ref 0.1–1.0)
MONOS PCT: 8 % (ref 3–12)
Neutro Abs: 1.5 10*3/uL — ABNORMAL LOW (ref 1.7–7.7)
Neutrophils Relative %: 39 % — ABNORMAL LOW (ref 43–77)
Platelets: 214 10*3/uL (ref 150–400)
RBC: 4.63 MIL/uL (ref 3.87–5.11)
RDW: 14 % (ref 11.5–15.5)
WBC: 3.9 10*3/uL — AB (ref 4.0–10.5)

## 2014-03-26 LAB — URINALYSIS W MICROSCOPIC + REFLEX CULTURE
Bacteria, UA: NONE SEEN
Bilirubin Urine: NEGATIVE
Casts: NONE SEEN
Crystals: NONE SEEN
Glucose, UA: NEGATIVE mg/dL
Hgb urine dipstick: NEGATIVE
Ketones, ur: NEGATIVE mg/dL
Leukocytes, UA: NEGATIVE
Nitrite: NEGATIVE
Protein, ur: NEGATIVE mg/dL
Specific Gravity, Urine: 1.012 (ref 1.005–1.030)
Squamous Epithelial / HPF: NONE SEEN
Urobilinogen, UA: 0.2 mg/dL (ref 0.0–1.0)
pH: 8 (ref 5.0–8.0)

## 2014-03-26 LAB — LIPID PANEL
Cholesterol: 174 mg/dL (ref 0–200)
HDL: 60 mg/dL
LDL Cholesterol: 101 mg/dL — ABNORMAL HIGH (ref 0–99)
Total CHOL/HDL Ratio: 2.9 ratio
Triglycerides: 63 mg/dL
VLDL: 13 mg/dL (ref 0–40)

## 2014-03-26 MED ORDER — ESTRADIOL 0.075 MG/24HR TD PTTW: 1.0000 | MEDICATED_PATCH | TRANSDERMAL | Status: DC

## 2014-03-26 NOTE — Patient Instructions (Signed)
BONE SCAN [place] TUES 04/02/14 AT NOON ARRIVAL TIME IS 1145A REGISTER IN 2ND FLOOR ADMITTING IN THE [place] TOWERS [phone]

## 2014-03-26 NOTE — Progress Notes (Signed)
Anne Mann January 08, 1961 [id number]        53 y.o.  [id number] for annual exam.  Several issues noted below.  Past medical history,surgical history, problem list, medications, allergies, family history and social history were all reviewed and documented as reviewed in the EPIC chart.  ROS:  12 system ROS performed with pertinent positives and negatives included in the history, assessment and plan.   Additional significant findings :  None   Exam: [name] assistant Filed Vitals:   03/26/14 0850  BP: 116/76  Height: 5' 4.5" (1.638 m)  Weight: 147 lb (66.679 kg)   General appearance:  Normal affect, orientation and appearance. Skin: Grossly normal HEENT: Without gross lesions.  No cervical or supraclavicular adenopathy. Thyroid normal.  Lungs:  Clear without wheezing, rales or rhonchi Cardiac: RR, without RMG Abdominal:  Soft, nontender, without masses, guarding, rebound, organomegaly or hernia Breasts:  Examined lying and sitting without masses, retractions, discharge or axillary adenopathy. Pelvic:  Ext/BUS/vagina normal  Cervix normal, IUD string visualized  Uterus anteverted, normal size, shape and contour, midline and mobile nontender   Adnexa  Without masses or tenderness    Anus and perineum  Normal   Rectovaginal  Normal sphincter tone without palpated masses or tenderness.    Assessment/Plan:  53 y.o. [id number] female for annual exam.   1. HRT. Patient doing well on Vivelle 0.075 mg patches. Using her Mirena IUD for endometrial protection. No bleeding.  I again reviewed the issues of HRT and risks as outlined in the 03/22/2013 note. We also discussed the offbrand labeling use of Mirena for endometrial protection. Patient asked about whether she would be at risk for pregnancy if we remove the IUD. I discussed highly unlikely given her situation but no guarantees. She would like to replace her IUD and continue to use it for endometrial protection and will schedule an appointment to do  so. If she decides against this and she will use Prometrium 100 mg nightly after removal of her IUD. Patient knows to call if she does any bleeding this coming year. 2. Mirena IUD 05/2009. Due to be replaced over the next one to 2 months. Will followup for this. 3. Pap smear/HPV negative 2013. No Pap smear done today. 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 Plan repeat Pap smear at 3-5 year interval. 4. Mammography 09/2013. Continue with annual mammography. SBE monthly reviewed. 5. Colonoscopy reported 2011. Repeat Pap smear recommended interval. 6. History of HSV. Has not had an outbreak in a long time. Offered Valtrex prescription to have on hand and she declined. She'll call if she needs prescription. 7. Health maintenance. Baseline CBC comprehensive metabolic panel lipid profile urinalysis vitamin D ordered. TSH normal last year. Followup for IUD replacement otherwise annually.   Note: This document was prepared with digital dictation and possible smart phrase technology. Any transcriptional errors that result from this process are unintentional.   [name] MD, 9:27 AM 03/26/2014

## 2014-03-26 NOTE — Patient Instructions (Signed)
Followup for IUD replacement appointment as scheduled.  You may obtain a copy of any labs that were done today by logging onto MyChart as outlined in the instructions provided with your AVS (after visit summary). The office will not call with normal lab results but certainly if there are any significant abnormalities then we will contact you.   Health Maintenance, Female A healthy lifestyle and preventative care can promote health and wellness.  Maintain regular health, dental, and eye exams.  Eat a healthy diet. Foods like vegetables, fruits, whole grains, low-fat dairy products, and lean protein foods contain the nutrients you need without too many calories. Decrease your intake of foods high in solid fats, added sugars, and salt. Get information about a proper diet from your caregiver, if necessary.  Regular physical exercise is one of the most important things you can do for your health. Most adults should get at least 150 minutes of moderate-intensity exercise (any activity that increases your heart rate and causes you to sweat) each week. In addition, most adults need muscle-strengthening exercises on 2 or more days a week.   Maintain a healthy weight. The body mass index (BMI) is a screening tool to identify possible weight problems. It provides an estimate of body fat based on height and weight. Your caregiver can help determine your BMI, and can help you achieve or maintain a healthy weight. For adults 20 years and older:  A BMI below 18.5 is considered underweight.  A BMI of 18.5 to 24.9 is normal.  A BMI of 25 to 29.9 is considered overweight.  A BMI of 30 and above is considered obese.  Maintain normal blood lipids and cholesterol by exercising and minimizing your intake of saturated fat. Eat a balanced diet with plenty of fruits and vegetables. Blood tests for lipids and cholesterol should begin at age 12 and be repeated every 5 years. If your lipid or cholesterol levels are  high, you are over 50, or you are a high risk for heart disease, you may need your cholesterol levels checked more frequently.Ongoing high lipid and cholesterol levels should be treated with medicines if diet and exercise are not effective.  If you smoke, find out from your caregiver how to quit. If you do not use tobacco, do not start.  Lung cancer screening is recommended for adults aged 46 80 years who are at high risk for developing lung cancer because of a history of smoking. Yearly low-dose computed tomography (CT) is recommended for people who have at least a 30-pack-year history of smoking and are a current smoker or have quit within the past 15 years. A pack year of smoking is smoking an average of 1 pack of cigarettes a day for 1 year (for example: 1 pack a day for 30 years or 2 packs a day for 15 years). Yearly screening should continue until the smoker has stopped smoking for at least 15 years. Yearly screening should also be stopped for people who develop a health problem that would prevent them from having lung cancer treatment.  If you are pregnant, do not drink alcohol. If you are breastfeeding, be very cautious about drinking alcohol. If you are not pregnant and choose to drink alcohol, do not exceed 1 drink per day. One drink is considered to be 12 ounces (355 mL) of beer, 5 ounces (148 mL) of wine, or 1.5 ounces (44 mL) of liquor.  Avoid use of street drugs. Do not share needles with anyone. Ask for help  if you need support or instructions about stopping the use of drugs.  High blood pressure causes heart disease and increases the risk of stroke. Blood pressure should be checked at least every 1 to 2 years. Ongoing high blood pressure should be treated with medicines, if weight loss and exercise are not effective.  If you are 32 to 51 years old, ask your caregiver if you should take aspirin to prevent strokes.  Diabetes screening involves taking a blood sample to check your fasting  blood sugar level. This should be done once every 3 years, after age 76, if you are within normal weight and without risk factors for diabetes. Testing should be considered at a younger age or be carried out more frequently if you are overweight and have at least 1 risk factor for diabetes.  Breast cancer screening is essential preventative care for women. You should practice "breast self-awareness." This means understanding the normal appearance and feel of your breasts and may include breast self-examination. Any changes detected, no matter how small, should be reported to a caregiver. Women in their 60s and 30s should have a clinical breast exam (CBE) by a caregiver as part of a regular health exam every 1 to 3 years. After age 36, women should have a CBE every year. Starting at age 62, women should consider having a mammogram (breast X-ray) every year. Women who have a family history of breast cancer should talk to their caregiver about genetic screening. Women at a high risk of breast cancer should talk to their caregiver about having an MRI and a mammogram every year.  Breast cancer gene (BRCA)-related cancer risk assessment is recommended for women who have family members with BRCA-related cancers. BRCA-related cancers include breast, ovarian, tubal, and peritoneal cancers. Having family members with these cancers may be associated with an increased risk for harmful changes (mutations) in the breast cancer genes BRCA1 and BRCA2. Results of the assessment will determine the need for genetic counseling and BRCA1 and BRCA2 testing.  The Pap test is a screening test for cervical cancer. Women should have a Pap test starting at age 61. Between ages 109 and 68, Pap tests should be repeated every 2 years. Beginning at age 75, you should have a Pap test every 3 years as long as the past 3 Pap tests have been normal. If you had a hysterectomy for a problem that was not cancer or a condition that could lead to  cancer, then you no longer need Pap tests. If you are between ages 52 and 73, and you have had normal Pap tests going back 10 years, you no longer need Pap tests. If you have had past treatment for cervical cancer or a condition that could lead to cancer, you need Pap tests and screening for cancer for at least 20 years after your treatment. If Pap tests have been discontinued, risk factors (such as a new sexual partner) need to be reassessed to determine if screening should be resumed. Some women have medical problems that increase the chance of getting cervical cancer. In these cases, your caregiver may recommend more frequent screening and Pap tests.  The human papillomavirus (HPV) test is an additional test that may be used for cervical cancer screening. The HPV test looks for the virus that can cause the cell changes on the cervix. The cells collected during the Pap test can be tested for HPV. The HPV test could be used to screen women aged 74 years and older, and  should be used in women of any age who have unclear Pap test results. After the age of 61, women should have HPV testing at the same frequency as a Pap test.  Colorectal cancer can be detected and often prevented. Most routine colorectal cancer screening begins at the age of 48 and continues through age 10. However, your caregiver may recommend screening at an earlier age if you have risk factors for colon cancer. On a yearly basis, your caregiver may provide home test kits to check for hidden blood in the stool. Use of a small camera at the end of a tube, to directly examine the colon (sigmoidoscopy or colonoscopy), can detect the earliest forms of colorectal cancer. Talk to your caregiver about this at age 70, when routine screening begins. Direct examination of the colon should be repeated every 5 to 10 years through age 19, unless early forms of pre-cancerous polyps or small growths are found.  Hepatitis C blood testing is recommended for  all people born from [birth year] through 1965 and any individual with known risks for hepatitis C.  Practice safe sex. Use condoms and avoid high-risk sexual practices to reduce the spread of sexually transmitted infections (STIs). Sexually active women aged 73 and younger should be checked for Chlamydia, which is a common sexually transmitted infection. Older women with new or multiple partners should also be tested for Chlamydia. Testing for other STIs is recommended if you are sexually active and at increased risk.  Osteoporosis is a disease in which the bones lose minerals and strength with aging. This can result in serious bone fractures. The risk of osteoporosis can be identified using a bone density scan. Women ages 29 and over and women at risk for fractures or osteoporosis should discuss screening with their caregivers. Ask your caregiver whether you should be taking a calcium supplement or vitamin D to reduce the rate of osteoporosis.  Menopause can be associated with physical symptoms and risks. Hormone replacement therapy is available to decrease symptoms and risks. You should talk to your caregiver about whether hormone replacement therapy is right for you.  Use sunscreen. Apply sunscreen liberally and repeatedly throughout the day. You should seek shade when your shadow is shorter than you. Protect yourself by wearing long sleeves, pants, a wide-brimmed hat, and sunglasses year round, whenever you are outdoors.  Notify your caregiver of new moles or changes in moles, especially if there is a change in shape or color. Also notify your caregiver if a mole is larger than the size of a pencil eraser.  Stay current with your immunizations. Document Released: 02/01/2011 Document Revised: 11/13/2012 Document Reviewed: 02/01/2011 [facility] Patient Information 2014 [place].

## 2014-03-26 NOTE — Progress Notes (Signed)
  Anne Mann - 53 y.o. female MRN [id number]  Date of birth: 08-Oct-1960 SUBJECTIVE:     Anne Mann is a 53 y.o. female returning for follow up of right tibial pain.   She has had continued aching pain made worse by weight bearing since the last office visit 6 weeks ago. Early improvements in symptoms have plateaued and she reports nearly no change with conservative management. She has worn an air cast intermittently as well as a body helix compression sleeve which she believes is more helpful and prefers because it is less bulky. She has been cycling and doing water aerobics, but not running or long distance walking.   ROS:     Denies fevers, chills, myalgias, arthralgias, and rashes.   PERTINENT  PMH / PSH FH / / SH:  Past Medical, Surgical, Social, and Family History Reviewed & Updated in the EMR.   Pertinent findings include: Migraines  OBJECTIVE: BP 128/85  Ht  (1.626 m)  Wt 135 lb (61.236 kg)  BMI 23.16 kg/m2  Physical Exam:  Vital signs are reviewed. Gen: Pleasant 53 y.o. female in NAD Right leg: No gross deformities, erythema, ecchymosis; pain to palpation over middle antero-medial tibia. Knee and ankle normal. Hop test on left leg: negative; on right leg: pain provoked in first few hops.   ASSESSMENT & PLAN: Right tibial pain: Chronic (~2 years) with evidence of tibial stress reaction without fracture on previous MRI or recent x-rays. Symptoms not improving despite long-term conservative measures, so further investigation is warranted to determine management.  - Triple phase bone scan to evaluate for tibial stress fracture. Will call patient with results to determine follow up. We will delineate further treatment based on those results.  Mahreen Schewe B. Jarvis Newcomer, MD, PGY-2 03/26/2014 10:29 AM

## 2014-03-27 ENCOUNTER — Other Ambulatory Visit: Payer: Self-pay | Admitting: Gynecology

## 2014-03-27 ENCOUNTER — Telehealth: Payer: Self-pay | Admitting: Gynecology

## 2014-03-27 DIAGNOSIS — Z3049 Encounter for surveillance of other contraceptives: Secondary | ICD-10-CM

## 2014-03-27 LAB — VITAMIN D 25 HYDROXY (VIT D DEFICIENCY, FRACTURES): VIT D 25 HYDROXY: 38 ng/mL (ref 30–89)

## 2014-03-27 MED ORDER — LEVONORGESTREL 20 MCG/24HR IU IUD
INTRAUTERINE_SYSTEM | Freq: Once | INTRAUTERINE | Status: DC
Start: 2014-03-27 — End: 2020-05-07

## 2014-03-27 NOTE — Telephone Encounter (Signed)
03/27/14-Pt was advised today that her [facility] ins will cover the removal of existing IUD and insertion of new Mirena at 100%. Appt is already made/wl

## 2014-04-02 ENCOUNTER — Ambulatory Visit (HOSPITAL_COMMUNITY)
Admission: RE | Admit: 2014-04-02 | Discharge: 2014-04-02 | Disposition: A | Discharge status: Home / Self Care | Payer: BC Managed Care – PPO | Source: Ambulatory Visit | Attending: Diagnostic Radiology | Admitting: Diagnostic Radiology

## 2014-04-02 ENCOUNTER — Encounter (HOSPITAL_COMMUNITY)
Admission: RE | Admit: 2014-04-02 | Discharge: 2014-04-02 | Disposition: A | Discharge status: Home / Self Care | Payer: BC Managed Care – PPO | Source: Ambulatory Visit | Attending: Sports Medicine | Admitting: Sports Medicine

## 2014-04-02 DIAGNOSIS — M79609 Pain in unspecified limb: Secondary | ICD-10-CM | POA: Insufficient documentation

## 2014-04-02 DIAGNOSIS — M79604 Pain in right leg: Secondary | ICD-10-CM

## 2014-04-02 MED ORDER — TECHNETIUM TC 99M MEDRONATE IV KIT
25.0000 | PACK | Freq: Once | INTRAVENOUS | Status: AC | PRN
  Administered 2014-04-02: 25 via INTRAVENOUS

## 2014-04-04 ENCOUNTER — Telehealth: Payer: Self-pay | Admitting: Sports Medicine

## 2014-04-04 NOTE — Telephone Encounter (Signed)
I spoke with the patient on the phone today after reviewing the triple phase bone scan of her right lower extremity. There is no evidence of stress fracture. I recommended that she return to the office for a thorough running evaluation. She may also need custom orthotics. There is obviously a dynamic reason for her pain. I would also like to elicit the input of Dr. Darrick Penna so I'll have the patient see me on a daily he is also in the office.

## 2014-04-23 ENCOUNTER — Ambulatory Visit (INDEPENDENT_AMBULATORY_CARE_PROVIDER_SITE_OTHER): Payer: BC Managed Care – PPO | Admitting: Sports Medicine

## 2014-04-23 ENCOUNTER — Encounter: Payer: Self-pay | Admitting: Sports Medicine

## 2014-04-23 VITALS — BP 124/79 | Ht 64.0 in | Wt 145.0 lb

## 2014-04-23 DIAGNOSIS — M79609 Pain in unspecified limb: Secondary | ICD-10-CM

## 2014-04-23 DIAGNOSIS — M79604 Pain in right leg: Secondary | ICD-10-CM

## 2014-04-23 NOTE — Progress Notes (Signed)
   Subjective:    Patient ID: Anne Mann, female    DOB: 05-Mar-1961, 53 y.o.   MRN: [id number]  HPI Patient comes in today for followup. Recent triple phase bone scan showed no evidence of a mid tibial stress reaction or stress fracture. An MRI scan done in 2013 showed a small focus of edema in the mid tibia consistent with a stress reaction. Patient has continued to struggle with pain in this area and has been unable to return to running. She has been treated with a long Aircast, tibial stress fracture return to running program, green sports insoles, and compression sleeves. Despite all of this, she has been unable to return to running. I asked her to come in today so that I could reevaluate her along with Dr. Darrick Penna. She continues to localize the pain to her in right mid tibia.    Review of Systems     Objective:   Physical Exam Well-developed, well-nourished. No acute distress.  Right lower leg: There is still tenderness to palpation and percussion over the right mid tibia. No soft tissue swelling. No leg length discrepancy. She has hip abductor weakness bilaterally.  She has a cavus foot. Evaluation of her running form shows dynamic genu valgus on the right with pronation bilaterally right greater than left. She runs without a limp.       Assessment & Plan:  Chronic right lower leg pain secondary to tibial stress with running  Patient's triple phase bone scan is reassuring in that it shows no stress fracture. We are going to fit her with a pair of custom orthotics. She was also instructed in a comprehensive hip strengthening program and given heel walk and toe walks to help strengthen the lower leg muscles as well. She will start a run walk program and can increase her activity by 10% per week. Followup with me in 6 weeks for reevaluation.  Total time spent with the patient was 45 minutes with greater than 50% of that time spent in face-to-face consultation, orthotic construction,  and orthotic fitting.  Patient was fitted for a : standard, cushioned, semi-rigid orthotic. The orthotic was heated and afterward the patient stood on the orthotic blank positioned on the orthotic stand. The patient was positioned in subtalar neutral position and 10 degrees of ankle dorsiflexion in a weight bearing stance. After completion of molding, a stable base was applied to the orthotic blank. The blank was ground to a stable position for weight bearing. Size:6 Base:blue eva Posting:none Additional orthotic padding:none

## 2014-05-09 ENCOUNTER — Ambulatory Visit: Payer: BC Managed Care – PPO | Admitting: Gynecology

## 2014-05-21 ENCOUNTER — Ambulatory Visit: Payer: BC Managed Care – PPO | Admitting: Gynecology

## 2014-05-22 ENCOUNTER — Ambulatory Visit: Payer: BC Managed Care – PPO | Admitting: Gynecology

## 2014-05-30 ENCOUNTER — Ambulatory Visit: Payer: BC Managed Care – PPO | Admitting: Gynecology

## 2014-06-03 ENCOUNTER — Encounter: Payer: Self-pay | Admitting: Sports Medicine

## 2014-06-04 ENCOUNTER — Ambulatory Visit (INDEPENDENT_AMBULATORY_CARE_PROVIDER_SITE_OTHER): Payer: BC Managed Care – PPO | Admitting: Sports Medicine

## 2014-06-04 ENCOUNTER — Encounter: Payer: Self-pay | Admitting: Sports Medicine

## 2014-06-04 VITALS — BP 118/71 | Ht 64.0 in | Wt 145.0 lb

## 2014-06-04 DIAGNOSIS — M79604 Pain in right leg: Secondary | ICD-10-CM

## 2014-06-04 NOTE — Progress Notes (Signed)
   Subjective:    Patient ID: Anne Mann, female    DOB: 07/25/1961, 53 y.o.   MRN: [id number]  HPI Patient comes in today for follow-up on right lower leg pain. Overall, she is feeling much better. She has been doing her hip strengthening exercises as well as her toe walks and heel walks. Also wearing a calf sleeve. She has resumed a walk/run program and is doing well. She finds her custom orthotics to be comfortable as well.   Review of Systems     Objective:   Physical Exam Well-developed, well-nourished. No acute distress. Awake alert and oriented 3. Vital signs reviewed.  Minimal tenderness to palpation over the right mid tibia. No soft tissue swelling. Still some mild hip abductor weakness bilaterally but not as significant as on previous exam. Evaluation of her running form with her orthotic and still show some dynamic genu valgus the pronation seems to be corrected. She walks and runs without a limp.       Assessment & Plan:  Improving chronic right lower leg pain secondary to tibial stress with running  Patient is improving. Continue with walk/run program. We provided her with a new compression sleeve. I've also given her some VMO exercises to add to her home exercise program. She will continue with her orthotics and will follow-up with me in 8 weeks. If she continues to improve I will consider discharge at her follow-up visit.

## 2014-06-11 ENCOUNTER — Ambulatory Visit (INDEPENDENT_AMBULATORY_CARE_PROVIDER_SITE_OTHER): Payer: BC Managed Care – PPO | Admitting: Gynecology

## 2014-06-11 ENCOUNTER — Encounter: Payer: Self-pay | Admitting: Gynecology

## 2014-06-11 DIAGNOSIS — Z30433 Encounter for removal and reinsertion of intrauterine contraceptive device: Secondary | ICD-10-CM

## 2014-06-11 DIAGNOSIS — Z30430 Encounter for insertion of intrauterine contraceptive device: Secondary | ICD-10-CM

## 2014-06-11 MED ORDER — VALACYCLOVIR HCL 500 MG PO TABS: ORAL_TABLET | ORAL | Status: DC

## 2014-06-11 NOTE — Progress Notes (Signed)
Patient presents for Mirena IUD removal and replacement. She has read through the booklet, has no contraindications and signed the consent form. She again understands that it is offbrand labeling to use a Mirena for endometrial protection with HRT.  I reviewed the removal and insertional process with her as well as the risks to include infection, either immediate or long-term, uterine perforation or migration requiring surgery to remove, other complications such as pain, hormonal side effects, infertility and possibility of failure with subsequent pregnancy.   Exam with [name] assistant Pelvic: External BUS vagina normal. Cervix normal with IUD string visualized. Uterus anteverted normal size shape contour midline mobile nontender. Adnexa without masses or tenderness.  Procedure: The cervix was visualized with a speculum, the IUD string was grasped with the [facility] forcep and her old Mirena IUD was removed, shown to the patient and discarded. The cervix was then cleansed with Betadine, anterior lip grasped with a single-tooth tenaculum, the uterus was sounded and a Mirena IUD was placed according to manufacturer's recommendations without difficulty. The strings were trimmed. The patient tolerated well and will follow up in one month for a postinsertional check.  Lot number:  [id number]    [name],Anne Vannatter P MD, 2:45 PM 06/11/2014

## 2014-06-11 NOTE — Patient Instructions (Signed)
Intrauterine Device Insertion Most often, an intrauterine device (IUD) is inserted into the uterus to prevent pregnancy. There are 2 types of IUDs available:  Copper IUD--This type of IUD creates an environment that is not favorable to sperm survival. The mechanism of action of the copper IUD is not known for certain. It can stay in place for 10 years.  Hormone IUD--This type of IUD contains the hormone progestin (synthetic progesterone). The progestin thickens the cervical mucus and prevents sperm from entering the uterus, and it also thins the uterine lining. There is no evidence that the hormone IUD prevents implantation. One hormone IUD can stay in place for up to 5 years, and a different hormone IUD can stay in place for up to 3 years. An IUD is the most cost-effective birth control if left in place for the full duration. It may be removed at any time. LET YOUR HEALTH CARE PROVIDER KNOW ABOUT:  Any allergies you have.  All medicines you are taking, including vitamins, herbs, eye drops, creams, and over-the-counter medicines.  Previous problems you or members of your family have had with the use of anesthetics.  Any blood disorders you have.  Previous surgeries you have had.  Possibility of pregnancy.  Medical conditions you have. RISKS AND COMPLICATIONS  Generally, intrauterine device insertion is a safe procedure. However, as with any procedure, complications can occur. Possible complications include:  Accidental puncture (perforation) of the uterus.  Accidental placement of the IUD either in the muscle layer of the uterus (myometrium) or outside the uterus. If this happens, the IUD can be found essentially floating around the bowels and must be taken out surgically.  The IUD may fall out of the uterus (expulsion). This is more common in women who have recently had a child.   Pregnancy in the fallopian tube (ectopic).  Pelvic inflammatory disease (PID), which is infection of  the uterus and fallopian tubes. The risk of PID is slightly increased in the first 20 days after the IUD is placed, but the overall risk is still very low. BEFORE THE PROCEDURE  Schedule the IUD insertion for when you will have your menstrual period or right after, to make sure you are not pregnant. Placement of the IUD is better tolerated shortly after a menstrual cycle.  You may need to take tests or be examined to make sure you are not pregnant.  You may be required to take a pregnancy test.  You may be required to get checked for sexually transmitted infections (STIs) prior to placement. Placing an IUD in someone who has an infection can make the infection worse.  You may be given a pain reliever to take 1 or 2 hours before the procedure.  An exam will be performed to determine the size and position of your uterus.  Ask your health care provider about changing or stopping your regular medicines. PROCEDURE   A tool (speculum) is placed in the vagina. This allows your health care provider to see the lower part of the uterus (cervix).  The cervix is prepped with a medicine that lowers the risk of infection.  You may be given a medicine to numb each side of the cervix (intracervical or paracervical block). This is used to block and control any discomfort with insertion.  A tool (uterine sound) is inserted into the uterus to determine the length of the uterine cavity and the direction the uterus may be tilted.  A slim instrument (IUD inserter) is inserted through the cervical   canal and into your uterus.  The IUD is placed in the uterine cavity and the insertion device is removed.  The nylon string that is attached to the IUD and used for eventual IUD removal is trimmed. It is trimmed so that it lays high in the vagina, just outside the cervix. AFTER THE PROCEDURE  You may have bleeding after the procedure. This is normal. It varies from light spotting for a few days to menstrual-like  bleeding.  You may have mild cramping. Document Released: 03/17/2011 Document Revised: 05/09/2013 Document Reviewed: 01/07/2013 Exit[employer] Patient Information 2015 [employer]. This information is not intended to replace advice given to you by your health care provider. Make sure you discuss any questions you have with your health care provider.  

## 2014-06-12 ENCOUNTER — Encounter: Payer: Self-pay | Admitting: Gynecology

## 2014-07-17 ENCOUNTER — Ambulatory Visit (INDEPENDENT_AMBULATORY_CARE_PROVIDER_SITE_OTHER): Payer: BC Managed Care – PPO | Admitting: Gynecology

## 2014-07-17 ENCOUNTER — Encounter: Payer: Self-pay | Admitting: Gynecology

## 2014-07-17 DIAGNOSIS — Z30431 Encounter for routine checking of intrauterine contraceptive device: Secondary | ICD-10-CM

## 2014-07-17 NOTE — Progress Notes (Signed)
Anne Mann 01/31/1961 [id number]        53 y.o.  [id number] presents for IUD follow up exam. Doing well without complaints.  Past medical history,surgical history, problem list, medications, allergies, family history and social history were all reviewed and documented in the EPIC chart.  Directed ROS with pertinent positives and negatives documented in the history of present illness/assessment and plan.  Exam: [name] assistant General appearance:  Normal External BUS vagina normal. Cervix normal. IUD string visualized and appropriate length. Uterus normal size midline mobile nontender. Adnexa without masses or tenderness.  Assessment/Plan:  53 y.o. [id number] with normal IUD follow up exam. Will follow up in August 2016 when due for annual exam, sooner if any issues.     [name],Yamina Lenis P MD, 12:34 PM 07/17/2014

## 2014-07-17 NOTE — Patient Instructions (Signed)
Follow up in August 2016 for your annual exam, sooner if any issues.

## 2014-08-06 ENCOUNTER — Ambulatory Visit: Payer: BC Managed Care – PPO | Admitting: Sports Medicine

## 2014-10-21 ENCOUNTER — Encounter: Payer: Self-pay | Admitting: Gynecology

## 2014-12-16 ENCOUNTER — Encounter: Payer: Self-pay | Admitting: Gastroenterology

## 2015-02-24 ENCOUNTER — Ambulatory Visit (INDEPENDENT_AMBULATORY_CARE_PROVIDER_SITE_OTHER): Payer: BLUE CROSS/BLUE SHIELD | Admitting: Gastroenterology

## 2015-02-24 ENCOUNTER — Other Ambulatory Visit: Payer: BLUE CROSS/BLUE SHIELD

## 2015-02-24 ENCOUNTER — Encounter: Payer: Self-pay | Admitting: Gastroenterology

## 2015-02-24 VITALS — BP 104/62 | HR 76 | Ht 63.78 in | Wt 145.4 lb

## 2015-02-24 DIAGNOSIS — R1012 Left upper quadrant pain: Principal | ICD-10-CM

## 2015-02-24 DIAGNOSIS — G8929 Other chronic pain: Secondary | ICD-10-CM

## 2015-02-24 NOTE — Patient Instructions (Addendum)
We will get records sent from your previous gastroenterologist (Dr. Earle Gell at [place]) for review.  This will include any endoscopic (colonoscopy or upper endoscopy) procedures and any associated pathology reports.   You will be set up for a CT scan of abdomen and pelvis with IV and oral contrast for left sided abdominal pain.  You have been scheduled for a CT scan of the abdomen and pelvis at [facility] ([address]---this is in the same building as Press [facility]).   You are scheduled on 02/27/15 at 2 pm. You should arrive 15 minutes prior to your appointment time for registration. Please follow the written instructions below on the day of your exam:  WARNING: IF YOU ARE ALLERGIC TO IODINE/X-RAY DYE, PLEASE NOTIFY RADIOLOGY IMMEDIATELY AT [phone]! YOU WILL BE GIVEN A 13 HOUR PREMEDICATION PREP.  1) Do not eat or drink anything after 10 am (4 hours prior to your test) 2) You have been given 2 bottles of oral contrast to drink. The solution may taste better if refrigerated, but do NOT add ice or any other liquid to this solution. Shake well before drinking.    Drink 1 bottle of contrast @ 12 noon (2 hours prior to your exam)  Drink 1 bottle of contrast @ 1 pm (1 hour prior to your exam)  You may take any medications as prescribed with a small amount of water except for the following: Metformin, Glucophage, Glucovance, Avandamet, Riomet, Fortamet, Actoplus Met, Janumet, Glumetza or Metaglip. The above medications must be held the day of the exam AND 48 hours after the exam.  The purpose of you drinking the oral contrast is to aid in the visualization of your intestinal tract. The contrast solution may cause some diarrhea. Before your exam is started, you will be given a small amount of fluid to drink. Depending on your individual set of symptoms, you may also receive an intravenous injection of x-ray contrast/dye. Plan on being at [facility] for 30 minutes or  long, depending on the type of exam you are having performed.  This test typically takes 30-45 minutes to complete.  If you have any questions regarding your exam or if you need to reschedule, you may call the CT department at [phone] between the hours of 8:00 am and 5:00 pm, Monday-Friday.  ________________________________________________________________________  Dennis Bast will have labs checked today in the basement lab.  Please head down after you check out with the front desk  (celiac panel).

## 2015-02-24 NOTE — Progress Notes (Signed)
HPI: This is a very pleasant very pleasant 54 year old woman who was referred to me by Anne Brunette, MD  to evaluate  left-sided abdominal pains .    Chief complaint is left-sided abdominal pains  Labs 12/2014: cbc normal, cmet normal  LUQ pain, for about 6 months.  Not daily.    Tried prilosec for 2 weeks without improvement.  Tried probiotics, no improvement.  Pain occurs intermittently; sharp pain LUQ, nausea at same time, can radiate to back.  Usually will last several hours.    Usually after lunch, mid afternoon.    No particular foods bring it on.  Has been avoiding spicy foods.  Tried cutting out gluten (maybe some improvement).  No significant constipation or diarrhea.  Up abot 10 pounds from her usual.  Takes occasional NSAID 1-2 times per month.    Review of systems: Pertinent positive and negative review of systems were noted in the above HPI section. Complete review of systems was performed and was otherwise normal.   Past Medical History  Diagnosis Date  . HSV-2 infection   . Migraines   . Cervical dysplasia 1997  . Asthma   . Stress fracture     Past Surgical History  Procedure Laterality Date  . Tmj arthroscopy  1991  . Mirena      Inserted 06/11/2014  . Cervical biopsy  w/ loop electrode excision  1997    lgsil    Current Outpatient Prescriptions  Medication Sig Dispense Refill  . ALBUTEROL IN Inhale into the lungs.      . Budesonide-Formoterol Fumarate (SYMBICORT IN) Inhale into the lungs.      Marland Kitchen estradiol (VIVELLE-DOT) 0.075 MG/24HR Place 1 patch onto the skin 2 (two) times a week. 8 patch 11  . levonorgestrel (MIRENA) 20 MCG/24HR IUD 1 each by Intrauterine route once. INSERTED 05/2009     . Loratadine (CLARITIN PO) Take by mouth.      . Montelukast Sodium (SINGULAIR PO) Take by mouth.    Marland Kitchen PROVENTIL HFA 108 (90 BASE) MCG/ACT inhaler     . SYMBICORT 160-4.5 MCG/ACT inhaler   10  . valACYclovir (VALTREX) 500 MG tablet Take 1 tablet  twice daily for 3-5 days on outbreak then as needed 30 tablet 2   Current Facility-Administered Medications  Medication Dose Route Frequency Provider Last Rate Last Dose  . levonorgestrel (MIRENA) 20 MCG/24HR IUD   Intrauterine Once [name], MD        Allergies as of 02/24/2015 - Review Complete 02/24/2015  Allergen Reaction Noted  . Codeine Nausea And Vomiting     History reviewed. No pertinent family history.  History   Social History  . Marital Status: Divorced    Spouse Name: N/A  . Number of Children: 2  . Years of Education: N/A   Occupational History  . [employer]    Social History Main Topics  . Smoking status: Never Smoker   . Smokeless tobacco: Never Used  . Alcohol Use: No  . Drug Use: No  . Sexual Activity: Yes    Birth Control/ Protection: IUD     Comment: Mirena inserted 06/11/2014   Other Topics Concern  . Not on file   Social History Narrative     Physical Exam: Ht 5' 3.78" (1.62 m)  Wt 145 lb 6 oz (65.942 kg)  BMI 25.13 kg/m2 Constitutional: generally well-appearing Psychiatric: alert and oriented x3 Eyes: extraocular movements intact Mouth: oral pharynx moist, no lesions Neck: supple no lymphadenopathy Cardiovascular:  heart regular rate and rhythm Lungs: clear to auscultation bilaterally Abdomen: soft, nontender, nondistended, no obvious ascites, no peritoneal signs, normal bowel sounds Extremities: no lower extremity edema bilaterally Skin: no lesions on visible extremities   Assessment and plan: 54 y.o. female with  6 months of intermittent left-sided abdominal pain  Unclear etiology. She did notice slight improvement when she started avoiding wheat in her diet and so on this we will order celiac sprue panel. She had a colonoscopy about 4 years ago with Dr. Reece Agar and was normal from what she recalls. I don't think that needs to be repeated now. Her bowels do not play a real role in her pains. The first test  to recommend is a CT scan abdomen and pelvis we will go from there. If it is normal I would likely recommend upper endoscopy.   Rob Bunting, MD  Gastroenterology 02/24/2015, 3:27 PM  Cc: Anne Brunette, MD

## 2015-02-26 LAB — CELIAC PANEL 10
Endomysial Screen: NEGATIVE
GLIADIN IGA: 3 U (ref <20)
GLIADIN IGG: 2 U (ref <20)
IgA: 102 mg/dL (ref 69–380)
TISSUE TRANSGLUT AB: 1 U/mL (ref <6)
Tissue Transglutaminase Ab, IgA: 1 U/mL (ref <4)

## 2015-02-27 ENCOUNTER — Inpatient Hospital Stay: Admission: RE | Admit: 2015-02-27 | Payer: BLUE CROSS/BLUE SHIELD | Source: Ambulatory Visit

## 2015-03-03 ENCOUNTER — Ambulatory Visit (INDEPENDENT_AMBULATORY_CARE_PROVIDER_SITE_OTHER)
Admission: RE | Admit: 2015-03-03 | Discharge: 2015-03-03 | Disposition: A | Discharge status: Home / Self Care | Payer: BLUE CROSS/BLUE SHIELD | Source: Ambulatory Visit | Attending: Gastroenterology | Admitting: Gastroenterology

## 2015-03-03 ENCOUNTER — Telehealth: Payer: Self-pay | Admitting: Gastroenterology

## 2015-03-03 DIAGNOSIS — R1012 Left upper quadrant pain: Secondary | ICD-10-CM

## 2015-03-03 DIAGNOSIS — G8929 Other chronic pain: Secondary | ICD-10-CM

## 2015-03-03 MED ORDER — IOHEXOL 300 MG/ML  SOLN
100.0000 mL | Freq: Once | INTRAMUSCULAR | Status: AC | PRN
  Administered 2015-03-03: 100 mL via INTRAVENOUS

## 2015-03-03 NOTE — Telephone Encounter (Signed)
Received records from [facility] forwarded 8 pages to Dr. Rob Bunting 03/03/15 fbg.

## 2015-03-05 ENCOUNTER — Other Ambulatory Visit (HOSPITAL_COMMUNITY): Payer: Self-pay | Admitting: Urology

## 2015-03-05 DIAGNOSIS — N135 Crossing vessel and stricture of ureter without hydronephrosis: Secondary | ICD-10-CM

## 2015-03-07 ENCOUNTER — Telehealth: Payer: Self-pay | Admitting: Gastroenterology

## 2015-03-07 NOTE — Telephone Encounter (Signed)
Colonoscopy 11/2009 Dr. Laural Benes at [facility], done for "RLQ pain."  Findings were normal to the terminal ileum  She should have screening colonoscopy 12/2019  Thanks

## 2015-03-10 ENCOUNTER — Ambulatory Visit (HOSPITAL_COMMUNITY)
Admission: RE | Admit: 2015-03-10 | Discharge: 2015-03-10 | Disposition: A | Discharge status: Home / Self Care | Payer: BLUE CROSS/BLUE SHIELD | Source: Ambulatory Visit | Attending: Urology | Admitting: Urology

## 2015-03-10 DIAGNOSIS — N135 Crossing vessel and stricture of ureter without hydronephrosis: Secondary | ICD-10-CM | POA: Diagnosis not present

## 2015-03-10 MED ORDER — FUROSEMIDE 10 MG/ML IJ SOLN
33.0000 mg | Freq: Once | INTRAMUSCULAR | Status: AC
  Administered 2015-03-10: 33 mg via INTRAVENOUS

## 2015-03-10 MED ORDER — FUROSEMIDE 10 MG/ML IJ SOLN
INTRAMUSCULAR | Status: AC
  Filled 2015-03-10: qty 4

## 2015-03-10 MED ORDER — TECHNETIUM TC 99M MERTIATIDE
16.0000 | Freq: Once | INTRAVENOUS | Status: AC | PRN
  Administered 2015-03-10: 16 via INTRAVENOUS

## 2015-03-10 NOTE — Telephone Encounter (Signed)
The patient has been notified of this information and all questions answered.

## 2015-03-10 NOTE — Telephone Encounter (Signed)
Recall in EPIC pt has been called to notify of the recall and previous colon findings.  Left message on machine to call back

## 2015-03-17 ENCOUNTER — Other Ambulatory Visit: Payer: Self-pay | Admitting: Gynecology

## 2015-03-27 ENCOUNTER — Other Ambulatory Visit: Payer: Self-pay | Admitting: Urology

## 2015-04-01 ENCOUNTER — Ambulatory Visit (INDEPENDENT_AMBULATORY_CARE_PROVIDER_SITE_OTHER): Payer: BLUE CROSS/BLUE SHIELD | Admitting: Gynecology

## 2015-04-01 ENCOUNTER — Encounter: Payer: Self-pay | Admitting: Gynecology

## 2015-04-01 VITALS — BP 124/78 | Ht 64.0 in | Wt 145.0 lb

## 2015-04-01 DIAGNOSIS — Z01419 Encounter for gynecological examination (general) (routine) without abnormal findings: Secondary | ICD-10-CM | POA: Diagnosis not present

## 2015-04-01 MED ORDER — ESTRADIOL 0.075 MG/24HR TD PTTW: MEDICATED_PATCH | TRANSDERMAL | Status: DC

## 2015-04-01 NOTE — Progress Notes (Signed)
Anne Mann [birth year] [id number]        54 y.o.  [id number] for annual exam.  Doing well without complaints.  Past medical history,surgical history, problem list, medications, allergies, family history and social history were all reviewed and documented as reviewed in the EPIC chart.  ROS:  Performed with pertinent positives and negatives included in the history, assessment and plan.   Additional significant findings :  none   Exam: [facility] person Vitals:   04/01/15 1226  BP: 124/78  Height:  (1.626 m)  Weight: 145 lb (65.772 kg)   General appearance:  Normal affect, orientation and appearance. Skin: Grossly normal HEENT: Without gross lesions.  No cervical or supraclavicular adenopathy. Thyroid normal.  Lungs:  Clear without wheezing, rales or rhonchi Cardiac: RR, without RMG Abdominal:  Soft, nontender, without masses, guarding, rebound, organomegaly or hernia Breasts:  Examined lying and sitting without masses, retractions, discharge or axillary adenopathy. Pelvic:  Ext/BUS/vagina normal  Cervix normal. IUD string visualized  Uterus anteverted, normal size, shape and contour, midline and mobile nontender   Adnexa  Without masses or tenderness    Anus and perineum  Normal   Rectovaginal  Normal sphincter tone without palpated masses or tenderness.    Assessment/Plan:  54 y.o. [id number] female for annual exam.   1. Postmenopausal/HRT. Patient continues on Vivelle dot 0.075 doing well wants to continue. Using the Mirena IUD for progesterone protection. I again reviewed the offbrand labeling indication for this in the issues of HRT to include the WHI study with increased risk of stroke heart attack DVT and breast cancer. Patient understands the issues and wants to continue and I refilled her 1 year. 2. Mirena IUD 06/2014. IUD string visualized. Patient doing well with no bleeding. Nose to report any bleeding. 3. Pap smear/HPV negative 2013. No Pap smear done today. History  of LEEP 1997 for LGSIL with normal Pap smears afterwards. Plan repeat Pap smear another year or 2 per current screening guidelines. 4. Mammography 10/2014. Continue with a mammography. SBE monthly reviewed. 5. Colonoscopy 2012. Repeat at their recommended interval. 6. Health maintenance. Patient reports having routine blood work done earlier this year at her primary physician's office. Follow up in one year, sooner as needed.   Anne Lords MD, 12:51 PM 04/01/2015

## 2015-04-01 NOTE — Patient Instructions (Signed)

## 2015-04-02 LAB — URINALYSIS W MICROSCOPIC + REFLEX CULTURE
BACTERIA UA: NONE SEEN [HPF]
Bilirubin Urine: NEGATIVE
CASTS: NONE SEEN [LPF]
CRYSTALS: NONE SEEN [HPF]
Glucose, UA: NEGATIVE
Hgb urine dipstick: NEGATIVE
Ketones, ur: NEGATIVE
Leukocytes, UA: NEGATIVE
Nitrite: NEGATIVE
PROTEIN: NEGATIVE
RBC / HPF: NONE SEEN RBC/HPF (ref <=2)
Specific Gravity, Urine: 1.01 (ref 1.001–1.035)
Squamous Epithelial / LPF: NONE SEEN [HPF] (ref <=5)
WBC, UA: NONE SEEN WBC/HPF (ref <=5)
Yeast: NONE SEEN [HPF]
pH: 5.5 (ref 5.0–8.0)

## 2015-05-06 NOTE — Patient Instructions (Addendum)
YOUR PROCEDURE IS SCHEDULED ON :  05/16/15  REPORT TO [facility] MAIN ENTRANCE FOLLOW SIGNS TO EAST ELEVATOR - GO TO 3rd FLOOR CHECK IN AT 3 EAST NURSES STATION (SHORT STAY) AT:  6:00 AM  CALL THIS NUMBER IF YOU HAVE PROBLEMS THE MORNING OF SURGERY [phone]  REMEMBER:ONLY 1 PER PERSON MAY GO TO SHORT STAY WITH YOU TO GET READY THE MORNING OF YOUR SURGERY  DO NOT EAT FOOD OR DRINK LIQUIDS AFTER MIDNIGHT  TAKE THESE MEDICINES THE MORNING OF SURGERY: MAY USE INHALERS IF NEEDED  YOU MAY NOT HAVE ANY METAL ON YOUR BODY INCLUDING HAIR PINS AND PIERCING'S. DO NOT WEAR JEWELRY, MAKEUP, LOTIONS, POWDERS OR PERFUMES. DO NOT WEAR NAIL POLISH. DO NOT SHAVE 48 HRS PRIOR TO SURGERY. MEN MAY SHAVE FACE AND NECK.  DO NOT BRING VALUABLES TO HOSPITAL. [facility] IS NOT RESPONSIBLE FOR VALUABLES.  CONTACTS, DENTURES OR PARTIALS MAY NOT BE WORN TO SURGERY. LEAVE SUITCASE IN CAR. CAN BE BROUGHT TO ROOM AFTER SURGERY.  PATIENTS DISCHARGED THE DAY OF SURGERY WILL NOT BE ALLOWED TO DRIVE HOME.  PLEASE READ OVER THE FOLLOWING INSTRUCTION SHEETS _________________________________________________________________________________                                          [facility] - PREPARING FOR SURGERY  Before surgery, you can play an important role.  Because skin is not sterile, your skin needs to be as free of germs as possible.  You can reduce the number of germs on your skin by washing with CHG (chlorahexidine gluconate) soap before surgery.  CHG is an antiseptic cleaner which kills germs and bonds with the skin to continue killing germs even after washing. Please DO NOT use if you have an allergy to CHG or antibacterial soaps.  If your skin becomes reddened/irritated stop using the CHG and inform your nurse when you arrive at Short Stay. Do not shave (including legs and underarms) for at least 48 hours prior to the first CHG shower.  You may shave your face. Please follow these  instructions carefully:   1.  Shower with CHG Soap the night before surgery and the  morning of Surgery.   2.  If you choose to wash your hair, wash your hair first as usual with your  normal  Shampoo.   3.  After you shampoo, rinse your hair and body thoroughly to remove the  shampoo.                                         4.  Use CHG as you would any other liquid soap.  You can apply chg directly  to the skin and wash . Gently wash with scrungie or clean wascloth    5.  Apply the CHG Soap to your body ONLY FROM THE NECK DOWN.   Do not use on open                           Wound or open sores. Avoid contact with eyes, ears mouth and genitals (private parts).                        Genitals (private parts) with your  normal soap.              6.  Wash thoroughly, paying special attention to the area where your surgery  will be performed.   7.  Thoroughly rinse your body with warm water from the neck down.   8.  DO NOT shower/wash with your normal soap after using and rinsing off  the CHG Soap .                9.  Pat yourself dry with a clean towel.             10.  Wear clean night clothes to bed after shower             11.  Place clean sheets on your bed the night of your first shower and do not  sleep with pets.  Day of Surgery : Do not apply any lotions/deodorants the morning of surgery.  Please wear clean clothes to the hospital/surgery center.  FAILURE TO FOLLOW THESE INSTRUCTIONS MAY RESULT IN THE CANCELLATION OF YOUR SURGERY    PATIENT SIGNATURE_________________________________  ______________________________________________________________________    WHAT IS A BLOOD TRANSFUSION? Blood Transfusion Information  A transfusion is the replacement of blood or some of its parts. Blood is made up of multiple cells which provide different functions.  Red blood cells carry oxygen and are used for blood loss replacement.  White blood cells fight against  infection.  Platelets control bleeding.  Plasma helps clot blood.  Other blood products are available for specialized needs, such as hemophilia or other clotting disorders. BEFORE THE TRANSFUSION  Who gives blood for transfusions?   Healthy volunteers who are fully evaluated to make sure their blood is safe. This is blood bank blood. Transfusion therapy is the safest it has ever been in the practice of medicine. Before blood is taken from a donor, a complete history is taken to make sure that person has no history of diseases nor engages in risky social behavior (examples are intravenous drug use or sexual activity with multiple partners). The donor's travel history is screened to minimize risk of transmitting infections, such as malaria. The donated blood is tested for signs of infectious diseases, such as HIV and hepatitis. The blood is then tested to be sure it is compatible with you in order to minimize the chance of a transfusion reaction. If you or a relative donates blood, this is often done in anticipation of surgery and is not appropriate for emergency situations. It takes many days to process the donated blood. RISKS AND COMPLICATIONS Although transfusion therapy is very safe and saves many lives, the main dangers of transfusion include:   Getting an infectious disease.  Developing a transfusion reaction. This is an allergic reaction to something in the blood you were given. Every precaution is taken to prevent this. The decision to have a blood transfusion has been considered carefully by your caregiver before blood is given. Blood is not given unless the benefits outweigh the risks. AFTER THE TRANSFUSION  Right after receiving a blood transfusion, you will usually feel much better and more energetic. This is especially true if your red blood cells have gotten low (anemic). The transfusion raises the level of the red blood cells which carry oxygen, and this usually causes an energy  increase.  The nurse administering the transfusion will monitor you carefully for complications. HOME CARE INSTRUCTIONS  No special instructions are needed after a transfusion. You may find your energy is better.  Speak with your caregiver about any limitations on activity for underlying diseases you may have. SEEK MEDICAL CARE IF:   Your condition is not improving after your transfusion.  You develop redness or irritation at the intravenous (IV) site. SEEK IMMEDIATE MEDICAL CARE IF:  Any of the following symptoms occur over the next 12 hours:  Shaking chills.  You have a temperature by mouth above 102 F (38.9 C), not controlled by medicine.  Chest, back, or muscle pain.  People around you feel you are not acting correctly or are confused.  Shortness of breath or difficulty breathing.  Dizziness and fainting.  You get a rash or develop hives.  You have a decrease in urine output.  Your urine turns a dark color or changes to pink, red, or brown. Any of the following symptoms occur over the next 10 days:  You have a temperature by mouth above 102 F (38.9 C), not controlled by medicine.  Shortness of breath.  Weakness after normal activity.  The white part of the eye turns yellow (jaundice).  You have a decrease in the amount of urine or are urinating less often.  Your urine turns a dark color or changes to pink, red, or brown. Document Released: 07/16/2000 Document Revised: 10/11/2011 Document Reviewed: 03/04/2008 [facility] Patient Information 2014 [place], [place].  _______________________________________________________________________

## 2015-05-08 ENCOUNTER — Encounter (HOSPITAL_COMMUNITY)
Admission: RE | Admit: 2015-05-08 | Discharge: 2015-05-08 | Disposition: A | Discharge status: Home / Self Care | Payer: BLUE CROSS/BLUE SHIELD | Source: Ambulatory Visit | Attending: Urology | Admitting: Urology

## 2015-05-08 ENCOUNTER — Encounter (HOSPITAL_COMMUNITY): Payer: Self-pay

## 2015-05-08 DIAGNOSIS — N135 Crossing vessel and stricture of ureter without hydronephrosis: Secondary | ICD-10-CM | POA: Insufficient documentation

## 2015-05-08 DIAGNOSIS — Z01818 Encounter for other preprocedural examination: Secondary | ICD-10-CM | POA: Diagnosis not present

## 2015-05-08 HISTORY — DX: Other complications of anesthesia, initial encounter: T88.59XA

## 2015-05-08 HISTORY — DX: Adverse effect of unspecified anesthetic, initial encounter: T41.45XA

## 2015-05-08 HISTORY — DX: Other specified postprocedural states: R11.2

## 2015-05-08 HISTORY — DX: Cardiac murmur, unspecified: R01.1

## 2015-05-08 HISTORY — DX: Crossing vessel and stricture of ureter without hydronephrosis: N13.5

## 2015-05-08 HISTORY — DX: Other specified postprocedural states: Z98.890

## 2015-05-08 LAB — ABO/RH: ABO/RH(D): A POS

## 2015-05-08 LAB — CBC
HEMATOCRIT: 43 % (ref 36.0–46.0)
Hemoglobin: 14.1 g/dL (ref 12.0–15.0)
MCH: 29.6 pg (ref 26.0–34.0)
MCHC: 32.8 g/dL (ref 30.0–36.0)
MCV: 90.3 fL (ref 78.0–100.0)
Platelets: 195 10*3/uL (ref 150–400)
RBC: 4.76 MIL/uL (ref 3.87–5.11)
RDW: 13 % (ref 11.5–15.5)
WBC: 5.2 10*3/uL (ref 4.0–10.5)

## 2015-05-08 LAB — BASIC METABOLIC PANEL
ANION GAP: 5 (ref 5–15)
BUN: 17 mg/dL (ref 6–20)
CHLORIDE: 105 mmol/L (ref 101–111)
CO2: 27 mmol/L (ref 22–32)
Calcium: 9.5 mg/dL (ref 8.9–10.3)
Creatinine, Ser: 0.75 mg/dL (ref 0.44–1.00)
GFR calc Af Amer: >60 mL/min (ref >60)
GFR calc non Af Amer: >60 mL/min (ref >60)
Glucose, Bld: 93 mg/dL (ref 65–99)
Potassium: 4.4 mmol/L (ref 3.5–5.1)
SODIUM: 137 mmol/L (ref 135–145)

## 2015-05-09 LAB — URINE CULTURE: Culture: 9000

## 2015-05-15 MED ORDER — HEPARIN SODIUM (PORCINE) 5000 UNIT/ML IJ SOLN
5000.0000 [IU] | INTRAMUSCULAR | Status: AC
  Administered 2015-05-16: 5000 [IU] via SUBCUTANEOUS
  Filled 2015-05-15 (×2): qty 1

## 2015-05-16 ENCOUNTER — Inpatient Hospital Stay (HOSPITAL_COMMUNITY): Payer: BLUE CROSS/BLUE SHIELD

## 2015-05-16 ENCOUNTER — Encounter (HOSPITAL_COMMUNITY): Payer: Self-pay | Admitting: *Deleted

## 2015-05-16 ENCOUNTER — Encounter (HOSPITAL_COMMUNITY)
Admission: RE | Disposition: A | Discharge status: Home / Self Care | Payer: Self-pay | Source: Ambulatory Visit | Attending: Urology

## 2015-05-16 ENCOUNTER — Inpatient Hospital Stay (HOSPITAL_COMMUNITY): Payer: BLUE CROSS/BLUE SHIELD | Admitting: Anesthesiology

## 2015-05-16 ENCOUNTER — Inpatient Hospital Stay (HOSPITAL_COMMUNITY)
Admission: RE | Admit: 2015-05-16 | Discharge: 2015-05-17 | DRG: 660 | Disposition: A | Discharge status: Home / Self Care | Payer: BLUE CROSS/BLUE SHIELD | Source: Ambulatory Visit | Attending: Urology | Admitting: Urology

## 2015-05-16 DIAGNOSIS — J45909 Unspecified asthma, uncomplicated: Secondary | ICD-10-CM | POA: Diagnosis present

## 2015-05-16 DIAGNOSIS — Z01812 Encounter for preprocedural laboratory examination: Secondary | ICD-10-CM

## 2015-05-16 DIAGNOSIS — R140 Abdominal distension (gaseous): Secondary | ICD-10-CM

## 2015-05-16 DIAGNOSIS — N131 Hydronephrosis with ureteral stricture, not elsewhere classified: Principal | ICD-10-CM | POA: Diagnosis present

## 2015-05-16 DIAGNOSIS — Z79899 Other long term (current) drug therapy: Secondary | ICD-10-CM

## 2015-05-16 DIAGNOSIS — N135 Crossing vessel and stricture of ureter without hydronephrosis: Secondary | ICD-10-CM | POA: Diagnosis present

## 2015-05-16 HISTORY — PX: ROBOT ASSISTED PYELOPLASTY: SHX5143

## 2015-05-16 HISTORY — PX: CYSTOSCOPY W/ RETROGRADES: SHX1426

## 2015-05-16 LAB — HEMOGLOBIN AND HEMATOCRIT, BLOOD
HCT: 38.2 % (ref 36.0–46.0)
Hemoglobin: 12.5 g/dL (ref 12.0–15.0)

## 2015-05-16 LAB — TYPE AND SCREEN
ABO/RH(D): A POS
ANTIBODY SCREEN: NEGATIVE

## 2015-05-16 LAB — PREGNANCY, URINE: PREG TEST UR: NEGATIVE

## 2015-05-16 SURGERY — ROBOTIC ASSISTED PYELOPLASTY
Anesthesia: General | Laterality: Left

## 2015-05-16 MED ORDER — GLYCOPYRROLATE 0.2 MG/ML IJ SOLN
INTRAMUSCULAR | Status: AC
  Filled 2015-05-16: qty 1

## 2015-05-16 MED ORDER — GLYCOPYRROLATE 0.2 MG/ML IJ SOLN
INTRAMUSCULAR | Status: DC | PRN
  Administered 2015-05-16: .6 mg via INTRAVENOUS

## 2015-05-16 MED ORDER — DEXAMETHASONE SODIUM PHOSPHATE 10 MG/ML IJ SOLN
INTRAMUSCULAR | Status: AC
  Filled 2015-05-16: qty 1

## 2015-05-16 MED ORDER — LIDOCAINE HCL (CARDIAC) 20 MG/ML IV SOLN
INTRAVENOUS | Status: DC | PRN
  Administered 2015-05-16: 100 mg via INTRAVENOUS

## 2015-05-16 MED ORDER — PROPOFOL 10 MG/ML IV BOLUS
INTRAVENOUS | Status: DC | PRN
  Administered 2015-05-16: 150 mg via INTRAVENOUS

## 2015-05-16 MED ORDER — PROMETHAZINE HCL 25 MG/ML IJ SOLN
INTRAMUSCULAR | Status: AC
Start: 2015-05-16 — End: 2015-05-17
  Filled 2015-05-16: qty 1

## 2015-05-16 MED ORDER — BUDESONIDE-FORMOTEROL FUMARATE 160-4.5 MCG/ACT IN AERO
2.0000 | INHALATION_SPRAY | Freq: Two times a day (BID) | RESPIRATORY_TRACT | Status: DC
  Filled 2015-05-16: qty 6

## 2015-05-16 MED ORDER — IOHEXOL 300 MG/ML  SOLN
INTRAMUSCULAR | Status: DC | PRN
  Administered 2015-05-16: 6 mL

## 2015-05-16 MED ORDER — SUFENTANIL CITRATE 50 MCG/ML IV SOLN
INTRAVENOUS | Status: AC
  Filled 2015-05-16: qty 1

## 2015-05-16 MED ORDER — HYDROMORPHONE HCL 1 MG/ML IJ SOLN
INTRAMUSCULAR | Status: DC | PRN
  Administered 2015-05-16 (×2): 1 mg via INTRAVENOUS

## 2015-05-16 MED ORDER — BUPIVACAINE-EPINEPHRINE (PF) 0.25% -1:200000 IJ SOLN
INTRAMUSCULAR | Status: AC
  Filled 2015-05-16: qty 30

## 2015-05-16 MED ORDER — ALBUTEROL SULFATE (2.5 MG/3ML) 0.083% IN NEBU: 2.5000 mg | INHALATION_SOLUTION | RESPIRATORY_TRACT | Status: DC | PRN

## 2015-05-16 MED ORDER — CIPROFLOXACIN IN D5W 400 MG/200ML IV SOLN
400.0000 mg | INTRAVENOUS | Status: AC
  Administered 2015-05-16: 400 mg via INTRAVENOUS

## 2015-05-16 MED ORDER — LACTATED RINGERS IV SOLN
INTRAVENOUS | Status: DC
  Administered 2015-05-16: 1000 mL via INTRAVENOUS

## 2015-05-16 MED ORDER — CEFAZOLIN SODIUM-DEXTROSE 2-3 GM-% IV SOLR
2.0000 g | INTRAVENOUS | Status: AC
  Administered 2015-05-16: 2 g via INTRAVENOUS

## 2015-05-16 MED ORDER — NEOSTIGMINE METHYLSULFATE 10 MG/10ML IV SOLN
INTRAVENOUS | Status: DC | PRN
  Administered 2015-05-16: 4 mg via INTRAVENOUS

## 2015-05-16 MED ORDER — SCOPOLAMINE 1 MG/3DAYS TD PT72
1.0000 | MEDICATED_PATCH | Freq: Once | TRANSDERMAL | Status: DC
  Administered 2015-05-16: 1.5 mg via TRANSDERMAL
  Filled 2015-05-16: qty 1

## 2015-05-16 MED ORDER — HYDROMORPHONE HCL 1 MG/ML IJ SOLN: 0.2500 mg | INTRAMUSCULAR | Status: DC | PRN

## 2015-05-16 MED ORDER — KETOROLAC TROMETHAMINE 30 MG/ML IJ SOLN
INTRAMUSCULAR | Status: AC
  Filled 2015-05-16: qty 1

## 2015-05-16 MED ORDER — ROCURONIUM BROMIDE 100 MG/10ML IV SOLN
INTRAVENOUS | Status: AC
  Filled 2015-05-16: qty 1

## 2015-05-16 MED ORDER — MIDAZOLAM HCL 5 MG/5ML IJ SOLN
INTRAMUSCULAR | Status: DC | PRN
  Administered 2015-05-16: 2 mg via INTRAVENOUS

## 2015-05-16 MED ORDER — HYDROMORPHONE HCL 2 MG/ML IJ SOLN
INTRAMUSCULAR | Status: AC
  Filled 2015-05-16: qty 1

## 2015-05-16 MED ORDER — DEXAMETHASONE SODIUM PHOSPHATE 10 MG/ML IJ SOLN
INTRAMUSCULAR | Status: DC | PRN
  Administered 2015-05-16: 10 mg via INTRAVENOUS

## 2015-05-16 MED ORDER — OXYCODONE HCL 5 MG/5ML PO SOLN
5.0000 mg | Freq: Once | ORAL | Status: DC | PRN
  Filled 2015-05-16: qty 5

## 2015-05-16 MED ORDER — MIDAZOLAM HCL 2 MG/2ML IJ SOLN
INTRAMUSCULAR | Status: AC
  Filled 2015-05-16: qty 4

## 2015-05-16 MED ORDER — CIPROFLOXACIN IN D5W 400 MG/200ML IV SOLN
INTRAVENOUS | Status: AC
  Filled 2015-05-16: qty 200

## 2015-05-16 MED ORDER — HYDROMORPHONE HCL 1 MG/ML IJ SOLN: 0.5000 mg | INTRAMUSCULAR | Status: DC | PRN

## 2015-05-16 MED ORDER — CEFAZOLIN SODIUM-DEXTROSE 2-3 GM-% IV SOLR
INTRAVENOUS | Status: AC
  Filled 2015-05-16: qty 50

## 2015-05-16 MED ORDER — SUFENTANIL CITRATE 50 MCG/ML IV SOLN
INTRAVENOUS | Status: DC | PRN
  Administered 2015-05-16: 5 ug via INTRAVENOUS
  Administered 2015-05-16 (×4): 10 ug via INTRAVENOUS
  Administered 2015-05-16: 5 ug via INTRAVENOUS

## 2015-05-16 MED ORDER — BUPIVACAINE-EPINEPHRINE 0.25% -1:200000 IJ SOLN
INTRAMUSCULAR | Status: DC | PRN
Start: 2015-05-16 — End: 2015-05-16
  Administered 2015-05-16: 30 mL

## 2015-05-16 MED ORDER — WATER FOR IRRIGATION, STERILE IR SOLN
Status: DC | PRN
  Administered 2015-05-16: 1000 mL

## 2015-05-16 MED ORDER — ALBUTEROL SULFATE HFA 108 (90 BASE) MCG/ACT IN AERS: 2.0000 | INHALATION_SPRAY | RESPIRATORY_TRACT | Status: DC | PRN

## 2015-05-16 MED ORDER — LACTATED RINGERS IV SOLN: INTRAVENOUS | Status: DC | PRN

## 2015-05-16 MED ORDER — SODIUM CHLORIDE 0.9 % IJ SOLN
INTRAMUSCULAR | Status: AC
  Filled 2015-05-16: qty 10

## 2015-05-16 MED ORDER — KETOROLAC TROMETHAMINE 15 MG/ML IJ SOLN
15.0000 mg | Freq: Four times a day (QID) | INTRAMUSCULAR | Status: DC
Start: 2015-05-16 — End: 2015-05-17
  Administered 2015-05-16 – 2015-05-17 (×4): 15 mg via INTRAVENOUS
  Filled 2015-05-16 (×4): qty 1

## 2015-05-16 MED ORDER — PROPOFOL 10 MG/ML IV BOLUS
INTRAVENOUS | Status: AC
  Filled 2015-05-16: qty 20

## 2015-05-16 MED ORDER — ONDANSETRON HCL 4 MG/2ML IJ SOLN: 4.0000 mg | INTRAMUSCULAR | Status: DC | PRN

## 2015-05-16 MED ORDER — SCOPOLAMINE 1 MG/3DAYS TD PT72
MEDICATED_PATCH | TRANSDERMAL | Status: DC | PRN
  Administered 2015-05-16: 1 via TRANSDERMAL

## 2015-05-16 MED ORDER — ESTRADIOL 0.1 MG/24HR TD PTWK: 0.1000 mg | MEDICATED_PATCH | TRANSDERMAL | Status: DC

## 2015-05-16 MED ORDER — MONTELUKAST SODIUM 10 MG PO TABS
10.0000 mg | ORAL_TABLET | Freq: Every day | ORAL | Status: DC
  Administered 2015-05-16: 10 mg via ORAL
  Filled 2015-05-16: qty 1

## 2015-05-16 MED ORDER — SCOPOLAMINE 1 MG/3DAYS TD PT72
MEDICATED_PATCH | TRANSDERMAL | Status: AC
  Filled 2015-05-16: qty 1

## 2015-05-16 MED ORDER — ACETAMINOPHEN 325 MG PO TABS
650.0000 mg | ORAL_TABLET | ORAL | Status: DC | PRN
  Administered 2015-05-16: 650 mg via ORAL
  Filled 2015-05-16: qty 2

## 2015-05-16 MED ORDER — ONDANSETRON HCL 4 MG/2ML IJ SOLN
INTRAMUSCULAR | Status: AC
  Filled 2015-05-16: qty 2

## 2015-05-16 MED ORDER — LIDOCAINE HCL (CARDIAC) 20 MG/ML IV SOLN
INTRAVENOUS | Status: AC
  Filled 2015-05-16: qty 5

## 2015-05-16 MED ORDER — HYDROCODONE-ACETAMINOPHEN 5-325 MG PO TABS
1.0000 | ORAL_TABLET | ORAL | Status: DC | PRN
  Administered 2015-05-16: 1 via ORAL
  Administered 2015-05-17: 2 via ORAL
  Filled 2015-05-16: qty 1
  Filled 2015-05-16: qty 2

## 2015-05-16 MED ORDER — ONDANSETRON HCL 4 MG/2ML IJ SOLN
INTRAMUSCULAR | Status: DC | PRN
  Administered 2015-05-16: 4 mg via INTRAVENOUS

## 2015-05-16 MED ORDER — OXYCODONE HCL 5 MG PO TABS: 5.0000 mg | ORAL_TABLET | Freq: Once | ORAL | Status: DC | PRN

## 2015-05-16 MED ORDER — PROMETHAZINE HCL 25 MG/ML IJ SOLN
6.2500 mg | INTRAMUSCULAR | Status: DC | PRN
  Administered 2015-05-16: 12.5 mg via INTRAVENOUS

## 2015-05-16 MED ORDER — LACTATED RINGERS IR SOLN
Status: DC | PRN
  Administered 2015-05-16: 1000 mL

## 2015-05-16 MED ORDER — ROCURONIUM BROMIDE 100 MG/10ML IV SOLN
INTRAVENOUS | Status: DC | PRN
  Administered 2015-05-16: 20 mg via INTRAVENOUS
  Administered 2015-05-16: 10 mg via INTRAVENOUS
  Administered 2015-05-16: 60 mg via INTRAVENOUS
  Administered 2015-05-16: 20 mg via INTRAVENOUS
  Administered 2015-05-16: 10 mg via INTRAVENOUS

## 2015-05-16 MED ORDER — KETOROLAC TROMETHAMINE 30 MG/ML IJ SOLN
INTRAMUSCULAR | Status: DC | PRN
  Administered 2015-05-16: 30 mg via INTRAVENOUS

## 2015-05-16 MED ORDER — LACTATED RINGERS IV SOLN
INTRAVENOUS | Status: DC | PRN
  Administered 2015-05-16 (×3): via INTRAVENOUS

## 2015-05-16 MED ORDER — HYDROCODONE-ACETAMINOPHEN 5-325 MG PO TABS: 1.0000 | ORAL_TABLET | Freq: Four times a day (QID) | ORAL | Status: DC | PRN

## 2015-05-16 MED ORDER — DEXTROSE-NACL 5-0.45 % IV SOLN
INTRAVENOUS | Status: DC
  Administered 2015-05-16 – 2015-05-17 (×3): via INTRAVENOUS

## 2015-05-16 MED ORDER — PHENYLEPHRINE 40 MCG/ML (10ML) SYRINGE FOR IV PUSH (FOR BLOOD PRESSURE SUPPORT)
PREFILLED_SYRINGE | INTRAVENOUS | Status: AC
  Filled 2015-05-16: qty 10

## 2015-05-16 SURGICAL SUPPLY — 56 items
BAG URO CATCHER STRL LF (DRAPE) ×2 IMPLANT
CANISTER OMNI JUG 16 LITER (MISCELLANEOUS) ×1 IMPLANT
CATH INTERMIT  6FR 70CM (CATHETERS) ×2 IMPLANT
CHLORAPREP W/TINT 26ML (MISCELLANEOUS) ×2 IMPLANT
CLIP LIGATING HEM O LOK PURPLE (MISCELLANEOUS) IMPLANT
CLIP LIGATING HEMO O LOK GREEN (MISCELLANEOUS) IMPLANT
CORD HIGH FREQUENCY UNIPOLAR (ELECTROSURGICAL) ×2 IMPLANT
CORDS BIPOLAR (ELECTRODE) ×2 IMPLANT
COVER TIP SHEARS 8 DVNC (MISCELLANEOUS) ×1 IMPLANT
COVER TIP SHEARS 8MM DA VINCI (MISCELLANEOUS) ×1
DECANTER SPIKE VIAL GLASS SM (MISCELLANEOUS) ×1 IMPLANT
DRAIN CHANNEL 15F RND FF 3/16 (WOUND CARE) ×2 IMPLANT
DRAPE INCISE IOBAN 66X45 STRL (DRAPES) ×2 IMPLANT
DRAPE LAPAROSCOPIC ABDOMINAL (DRAPES) ×2 IMPLANT
DRAPE SHEET LG 3/4 BI-LAMINATE (DRAPES) ×2 IMPLANT
DRAPE TABLE BACK 44X90 PK DISP (DRAPES) ×1 IMPLANT
DRAPE UTILITY XL STRL (DRAPES) ×2 IMPLANT
DRAPE WARM FLUID 44X44 (DRAPE) ×2 IMPLANT
ELECT PENCIL ROCKER SW 15FT (MISCELLANEOUS) ×2 IMPLANT
ELECT REM PT RETURN 9FT ADLT (ELECTROSURGICAL) ×2
ELECTRODE REM PT RTRN 9FT ADLT (ELECTROSURGICAL) ×1 IMPLANT
EVACUATOR SILICONE 100CC (DRAIN) ×2 IMPLANT
GLOVE BIOGEL M STRL SZ7.5 (GLOVE) ×6 IMPLANT
GOWN STRL REUS W/TWL LRG LVL3 (GOWN DISPOSABLE) ×14 IMPLANT
GUIDEWIRE STR DUAL SENSOR (WIRE) ×2 IMPLANT
KIT ACCESSORY DA VINCI DISP (KITS) ×1
KIT ACCESSORY DVNC DISP (KITS) ×1 IMPLANT
KIT BASIN OR (CUSTOM PROCEDURE TRAY) ×2 IMPLANT
NS IRRIG 1000ML POUR BTL (IV SOLUTION) ×1 IMPLANT
PACK CYSTO (CUSTOM PROCEDURE TRAY) ×2 IMPLANT
POSITIONER SURGICAL ARM (MISCELLANEOUS) ×4 IMPLANT
SET TUBE IRRIG SUCTION NO TIP (IRRIGATION / IRRIGATOR) ×1 IMPLANT
SHEET LAVH (DRAPES) IMPLANT
SOLUTION ANTI FOG 6CC (MISCELLANEOUS) ×2 IMPLANT
SOLUTION ELECTROLUBE (MISCELLANEOUS) ×2 IMPLANT
SPONGE LAP 18X18 X RAY DECT (DISPOSABLE) ×2 IMPLANT
STAPLER VISISTAT 35W (STAPLE) ×1 IMPLANT
STENT CONTOUR 6FRX24X.038 (STENTS) ×1 IMPLANT
SURGILUBE 3G PEEL PACK STRL (MISCELLANEOUS) ×5 IMPLANT
SUT ETHILON 3 0 PS 1 (SUTURE) ×1 IMPLANT
SUT MNCRL AB 4-0 PS2 18 (SUTURE) ×4 IMPLANT
SUT VIC AB 0 CT1 27 (SUTURE) ×2
SUT VIC AB 0 CT1 27XBRD ANTBC (SUTURE) ×3 IMPLANT
SUT VIC AB 0 UR5 27 (SUTURE) ×2 IMPLANT
SUT VIC AB 4-0 RB1 27 (SUTURE) ×6
SUT VIC AB 4-0 RB1 27XBRD (SUTURE) ×6 IMPLANT
SUT VICRYL 0 UR6 27IN ABS (SUTURE) ×4 IMPLANT
SYR BULB IRRIGATION 50ML (SYRINGE) IMPLANT
TOWEL OR NON WOVEN STRL DISP B (DISPOSABLE) ×2 IMPLANT
TRAY FOLEY W/METER SILVER 14FR (SET/KITS/TRAYS/PACK) ×2 IMPLANT
TRAY FOLEY W/METER SILVER 16FR (SET/KITS/TRAYS/PACK) ×1 IMPLANT
TRAY LAPAROSCOPIC (CUSTOM PROCEDURE TRAY) ×2 IMPLANT
TROCAR XCEL 12X100 BLDLESS (ENDOMECHANICALS) ×3 IMPLANT
TUBING CONNECTING 10 (TUBING) ×2 IMPLANT
TUBING INSUFFLATION 10FT LAP (TUBING) ×2 IMPLANT
WATER STERILE IRR 1500ML POUR (IV SOLUTION) ×2 IMPLANT

## 2015-05-16 NOTE — Op Note (Signed)
Preoperative diagnosis: Left ureteropelvic junction obstruction  Postoperative diagnosis: Left ureteropelvic junction obstruction  Procedure:  1. Cystoscopy 2. Left retrograde pyelography with interpretation 3. Left ureteral stent placement, 24 cm x 11F 4. Left robotic-assisted laparoscopic dismembered pyeloplasty  Surgeon: Crist Fat, M.D. Resident Assistant: Greggory Brandy, MD  Assistant(s): Harrie Foreman, PA  Anesthesia: General  Complications: None  EBL: 50 mL  Intraoperative findings:  Left retrograde pyelogram demonstrated a tortuous UPJ with renal pelvic dilation and a normal caliber ureter.  Drains:  1. # 15 Blake perinephric drain 2. 16 Fr Foley catheter  Indication: Anne Mann is a 54 y.o. patient with a suspected left ureteropelvic junction obstruction.  After a thorough review of the management options for their ureteropelvic junction obstruction, they elected to proceed with surgical treatment and the above procedure.  We have discussed the potential benefits and risks of the procedure, side effects of the proposed treatment, the likelihood of the patient achieving the goals of the procedure, and any potential problems that might occur during the procedure or recuperation. Informed consent has been obtained.  Description of procedure:  The patient was taken to the operating room and a general anesthetic was administered. The patient was given preoperative antibiotics, placed in the dorsal lithotomy position, and prepped and draped in the usual sterile fashion. Next a preoperative timeout was performed.  Cystourethroscopy was performed.  The patient's urethra was examined and was normal. The bladder was then systematically examined in its entirety. There was no evidence for any bladder tumors, stones, or other mucosal pathology.    Attention then turned to the left ureteral orifice and a ureteral catheter was used to intubate the ureteral orifice.  Omnipaque  contrast was injected through the ureteral catheter and a retrograde pyelogram was performed with findings as dictated above.  A 0.38 sensor guidewire was then advanced up the left ureter into the renal pelvis under fluoroscopic guidance.  The wire was then backloaded through the cystoscope and a ureteral stent was advance over the wire using Seldinger technique.  The stent was positioned appropriately under fluoroscopic and cystoscopic guidance.  The wire was then removed with an adequate stent curl noted in the renal pelvis as well as in the bladder. The bladder was then emptied and a 16 Fr Foley catheter was inserted.   The patient was then repositioned in the left modified flank position and prepped and draped in the usual sterile fashion. A site was selected on the left side of the umbilicus for placement of the camera port. This was placed using a standard open Hassan technique which allowed entry into the peritoneal cavity under direct vision and without difficulty. A 12 mm port was placed and a pneumoperitoneum established. The camera was then used to inspect the abdomen and there was no evidence of any intra-abdominal injuries or other abnormalities. The remaining abdominal ports were then placed. 8 mm robotic ports were placed in the ipsilateral upper quadrant, lower quadrant, and far lateral abdominal wall. A 12 mm port was placed in the upper midline for laparoscopic assistance. All ports were placed under direct vision without difficulty. The surgical cart was then docked.   Utilizing the cautery scissors, the white line of Toldt was incised allowing the plane between the mesocolon and the anterior layer of Gerota's fascia to be developed and the kidney exposed.  The ureter and gonadal vein were identified inferiorly and the ureter was lifted anteriorly off the psoas muscle.  Dissection proceeded superiorly along the  gonadal vein until the main renal hilum and renal pelvis was identified.  The  ureteropelvic junction was isolated from the surrounding structures with a combination of sharp and blunt dissection.    There was noted to be a lower pole crossing renal vessel.The ureteropelvic junction was divided allowing exposure of the indwelling stent and the ureteropelvic junction was excised and removed. The ureter and renal pelvis were then transposed anterior to the crossing lower pole renal vessels.  The ureter and renal pelvis were then examined.  Any excess renal pelvis was excised as needed and the renal pelvis and ureter were then spatulated appropriately.  4-0 vicryl sutures were then used to reapproximate the renal pelvis and ureter with running sutures.  The anastomosis was performed in a tension-free, watertight fashion with the ureter reconnected to the renal pelvis in a position to provide dependent drainage of the renal collecting system. The ureteral stent was repositioned appropriately prior to securing the final sutures of the ureteropelvic anastomosis.  A # 15 Blake perinephric drain was then brought through the lateral lower abdominal port site and positioned appropriately.  It was secured to the skin with a nylon suture.  The 12 mm port sites were then closed with 0-vicryl sutures placed laparoscopically with the laparoscopic suture passer. All remaining ports were removed under direct vision after hemostasis was confirmed with the pneumoperiotneum let down. All port sites were injected with 0.25% bupivicaine and reapproximated at the skin level with 4-0 monocryl subcuticular sutures. Dermabond was applied to the skin.  The patient appeared to tolerate the procedure well and without complications.  The patient was able to be extubated and transferred to the recovery unit in satisfactory condition.

## 2015-05-16 NOTE — Transfer of Care (Signed)
Immediate Anesthesia Transfer of Care Note  Patient: Anne Mann  Procedure(s) Performed: Procedure(s): ROBOTIC ASSISTED LAPAROSCOPIC LEFT PYELOPLASTY (Left) CYSTOSCOPY WITH LEFT RETROGRADE PYELOGRAM, STENT PLACEMENT (Left)  Patient Location: PACU  Anesthesia Type:General  Level of Consciousness: awake, alert , oriented and patient cooperative  Airway & Oxygen Therapy: Patient Spontanous Breathing and Patient connected to face mask oxygen  Post-op Assessment: Report given to RN, Post -op Vital signs reviewed and stable and Patient moving all extremities X 4  Post vital signs: stable  Last Vitals:  Filed Vitals:   05/16/15 1217  BP: 120/57  Pulse: 105  Temp: 36.8 C  Resp: 16    Complications: No apparent anesthesia complications

## 2015-05-16 NOTE — Anesthesia Preprocedure Evaluation (Addendum)
Anesthesia Evaluation  Patient identified by MRN, date of birth, ID band Patient awake    Reviewed: Allergy & Precautions, NPO status , Patient's Chart, lab work & pertinent test results  History of Anesthesia Complications (+) PONV  Airway Mallampati: II  TM Distance: >3 FB Neck ROM: Full    Dental  (+) Teeth Intact, Dental Advisory Given   Pulmonary asthma ,    breath sounds clear to auscultation       Cardiovascular negative cardio ROS   Rhythm:Regular Rate:Normal     Neuro/Psych  Headaches,    GI/Hepatic negative GI ROS, Neg liver ROS,   Endo/Other  negative endocrine ROS  Renal/GU Renal disease     Musculoskeletal   Abdominal   Peds  Hematology negative hematology ROS (+)   Anesthesia Other Findings   Reproductive/Obstetrics                            Lab Results  Component Value Date   WBC 5.2 05/08/2015   HGB 14.1 05/08/2015   HCT 43.0 05/08/2015   MCV 90.3 05/08/2015   PLT 195 05/08/2015   Lab Results  Component Value Date   CREATININE 0.75 05/08/2015   BUN 17 05/08/2015   NA 137 05/08/2015   K 4.4 05/08/2015   CL 105 05/08/2015   CO2 27 05/08/2015    Anesthesia Physical Anesthesia Plan  ASA: II  Anesthesia Plan: General   Post-op Pain Management:    Induction: Intravenous  Airway Management Planned: Oral ETT  Additional Equipment:   Intra-op Plan:   Post-operative Plan: Extubation in OR  Informed Consent: I have reviewed the patients History and Physical, chart, labs and discussed the procedure including the risks, benefits and alternatives for the proposed anesthesia with the patient or authorized representative who has indicated his/her understanding and acceptance.   Dental advisory given  Plan Discussed with: CRNA  Anesthesia Plan Comments:         Anesthesia Quick Evaluation

## 2015-05-16 NOTE — H&P (Signed)
Reason For Visit Left UPJ obstruction   History of Present Illness 80F referred by Dr. Vernie Ammons for further eval and management of a left UPJ obstruction. patient's symptoms had been present for the past several years. They have progressively gotten worse. Initially, the patient had some left upper quadrant pain intermittently with associated nausea. The patient then noted that several times a week she would have intense left flank pain with a feeling of being stabbed slowly resolved. This typically occurred in early afternoon after lunch. She has not noted any specific trigger of her pain. She denies any hematuria. She denies any dysuria. She has not had kidney stone or recurrent urinary tract infections. She has no GU surgical history. She had anesthesia one time before for jaw surgery. 25 years prior. The patient typically is in good shape, runs quite a bit. She recently had a right ankle injury. She has no history of diabetes or coronary artery disease. She does have a mild murmur which has been evaluated and is clinically insignificant.    The patient underwent a CT scan of her symptoms and was found to have left hydronephrosis with cortical thinning and evidence of a early branching artery and evidence of a lower pole crossing vessel at the level of her obstruction.  She then underwent a renogram demonstrating 28% differential function of the left kidney with a T1 half that was not achieved. This is evidence of severe obstruction.   Past Medical History Problems  1. History of asthma (Z87.09) 2. History of cervical dysplasia (Z87.410) 3. History of herpes simplex type 2 infection (Z86.19) 4. History of migraine headaches (Z86.69) 5. History of stress fracture (Z61.096)  Surgical History Problems  1. History of Arthroscopy Temporomandibular Joint 2. History of Biopsy Cervical 3. History of Gynecologic Services Intrauterine Device (IUD) Insertion  Current Meds 1. Albuterol 90 MCG/ACT  AERS;  Therapy: (Recorded:03Aug2016) to Recorded 2. Loratadine 10 MG Oral Tablet;  Therapy: (Recorded:03Aug2016) to Recorded 3. Mirena (52 MG) 20 MCG/24HR Intrauterine Intrauterine Device;  Therapy: (Recorded:03Aug2016) to Recorded 4. Singulair 10 MG Oral Tablet;  Therapy: (Recorded:03Aug2016) to Recorded 5. Symbicort AERO;  Therapy: (Recorded:03Aug2016) to Recorded 6. Valtrex 500 MG Oral Tablet;  Therapy: (Recorded:03Aug2016) to Recorded 7. Vivelle-Dot 0.025 MG/24HR Transdermal Patch Twice Weekly;  Therapy: (Recorded:03Aug2016) to Recorded  Allergies Medication  1. Codeine Derivatives  Family History Problems  1. No pertinent family history : Mother  Social History Problems  1. Denied: History of Alcohol use 2. Never a smoker 3. Two children  Review of Systems No weight loss, night sweats, loss of appetite, changes to her GI tract/bowel habits or neurological deficits.   Physical Exam Constitutional: Well nourished and well developed . No acute distress.  ENT:. The ears and nose are normal in appearance.  Neck: The appearance of the neck is normal and no neck mass is present.  Pulmonary: No respiratory distress and normal respiratory rhythm and effort.  Cardiovascular: Heart rate and rhythm are normal . No peripheral edema.  Abdomen: The abdomen is soft and nontender. No masses are palpated. No CVA tenderness. No hernias are palpable. No hepatosplenomegaly noted.  Lymphatics: The femoral and inguinal nodes are not enlarged or tender.  Skin: Normal skin turgor, no visible rash and no visible skin lesions.  Neuro/Psych:. Mood and affect are appropriate.    Results/Data Urine [Data Includes: Last 1 Day]   23Aug2016  COLOR STRAW   APPEARANCE CLEAR   SPECIFIC GRAVITY 1.015   pH 6.5   GLUCOSE NEGATIVE  BILIRUBIN NEGATIVE   KETONE NEGATIVE   BLOOD NEGATIVE   PROTEIN NEGATIVE   NITRITE NEGATIVE   LEUKOCYTE ESTERASE NEGATIVE    Normal urinalysis  I have  independently reviewed the patient's CT scan as well as the renogram.   Assessment The patient has a clinically significant left UPJ obstruction that is intermittently symptomatic.   Plan Health Maintenance  1. UA With REFLEX; [Do Not Release]; Status:Complete;   Done: 23Aug2016 03:34PM  Discussion/Summary I discussed the treatment options for this patient which include conservative therapy with observation and monitoring of her kidney function, placement of a double-J ureteral stent was still revealed changes, and robotic-assisted laparoscopic left pyeloplasty. Given the patient's good health and young age I recommended that she consider strongly pyeloplasty. I discussed the surgery with the patient in detail the various steps. She understands that we will be placing a double-J stent intraoperatively which will be indwelling for approximately 6 weeks. This will be removed in clinic. I discussed the symptoms and side effects of an indwelling stent. We also discussed the port placement and I explained to her that she would have a minimum of 5 small laparoscopic incisions. We discussed expected recovery time from her incisions. I told her that she could expect to be out between 4 and 6 weeks. I detailed the postoperative hospital course for the patient as well including the drain and the Foley catheter. I explained to her that typically patients are discharged home on postoperative day one or 2. I went over the risks and benefits of this operation including failure, recurrence, damage to the surrounding structures including spleen, colon, and pancreas. We also discussed neurovascular injuries. Finally, we discussed the risks of anesthesia which will be detailed more by the anesthesiologist and cells. Having gone over the treatment options, specifically the laparoscopic pyeloplasty, the patient would like to proceed. We'll get this scheduled at her convenience.

## 2015-05-16 NOTE — Progress Notes (Signed)
Received patient from PACU after surgery. Pt awake but sleep, hoarseness to her voice. Pt has no significant pain and nausea has improved. Pt has JP drain to to left lower abdomen and four other operative sites with glue adhesives. Family at bedside.

## 2015-05-16 NOTE — Anesthesia Procedure Notes (Signed)
Procedure Name: Intubation Date/Time: 05/16/2015 8:12 AM Performed by: Illene SilverEVANS, Coleman Kalas E Pre-anesthesia Checklist: Patient identified, Emergency Drugs available, Suction available and Patient being monitored Patient Re-evaluated:Patient Re-evaluated prior to inductionOxygen Delivery Method: Circle System Utilized Preoxygenation: Pre-oxygenation with 100% oxygen Intubation Type: IV induction Ventilation: Mask ventilation without difficulty Laryngoscope Size: Mac and 4 Grade View: Grade I Tube type: Oral Tube size: 8.0 mm Number of attempts: 1 Airway Equipment and Method: Stylet and Oral airway Placement Confirmation: ETT inserted through vocal cords under direct vision,  positive ETCO2 and breath sounds checked- equal and bilateral Secured at: 21 cm Tube secured with: Tape Dental Injury: Teeth and Oropharynx as per pre-operative assessment

## 2015-05-16 NOTE — Anesthesia Postprocedure Evaluation (Signed)
  Anesthesia Post-op Note  Patient: Anne Mann  Procedure(s) Performed: Procedure(s) (LRB): ROBOTIC ASSISTED LAPAROSCOPIC LEFT PYELOPLASTY (Left) CYSTOSCOPY WITH LEFT RETROGRADE PYELOGRAM, STENT PLACEMENT (Left)  Patient Location: PACU  Anesthesia Type: General  Level of Consciousness: awake and alert   Airway and Oxygen Therapy: Patient Spontanous Breathing  Post-op Pain: mild  Post-op Assessment: Post-op Vital signs reviewed, Patient's Cardiovascular Status Stable, Respiratory Function Stable, Patent Airway and No signs of Nausea or vomiting  Last Vitals:  Filed Vitals:   05/16/15 1320  BP: 128/66  Pulse: 90  Temp: 36.6 C  Resp: 14    Post-op Vital Signs: stable   Complications: No apparent anesthesia complications

## 2015-05-16 NOTE — Discharge Instructions (Signed)

## 2015-05-16 NOTE — Addendum Note (Signed)
Addendum  created 05/16/15 1551 by Illene SilverJanet E Susano Cleckler, CRNA   Modules edited: Anesthesia Flowsheet, Anesthesia Medication Administration

## 2015-05-17 LAB — BASIC METABOLIC PANEL
Anion gap: 3 — ABNORMAL LOW (ref 5–15)
BUN: 11 mg/dL (ref 6–20)
CALCIUM: 8.3 mg/dL — AB (ref 8.9–10.3)
CO2: 25 mmol/L (ref 22–32)
CREATININE: 0.83 mg/dL (ref 0.44–1.00)
Chloride: 108 mmol/L (ref 101–111)
GFR calc non Af Amer: >60 mL/min (ref >60)
Glucose, Bld: 131 mg/dL — ABNORMAL HIGH (ref 65–99)
Potassium: 4.1 mmol/L (ref 3.5–5.1)
SODIUM: 136 mmol/L (ref 135–145)

## 2015-05-17 LAB — HEMOGLOBIN AND HEMATOCRIT, BLOOD
HEMATOCRIT: 35.2 % — AB (ref 36.0–46.0)
HEMOGLOBIN: 11.7 g/dL — AB (ref 12.0–15.0)

## 2015-05-17 LAB — CREATININE, FLUID (PLEURAL, PERITONEAL, JP DRAINAGE): Creat, Fluid: 0.9 mg/dL

## 2015-05-17 NOTE — Progress Notes (Signed)
Looks good Incisions good Pain minimal Mobile Send fluid for cr and d/c home

## 2015-05-19 ENCOUNTER — Encounter (HOSPITAL_COMMUNITY): Payer: Self-pay | Admitting: Urology

## 2015-06-12 ENCOUNTER — Other Ambulatory Visit: Payer: Self-pay | Admitting: *Deleted

## 2015-06-12 MED ORDER — VALACYCLOVIR HCL 500 MG PO TABS: ORAL_TABLET | ORAL | Status: DC

## 2015-07-14 NOTE — Discharge Summary (Signed)
Date of admission: 05/16/2015  Date of discharge: 05/17/2015  Admission diagnosis: Left UPJ obstruction  Discharge diagnosis: same  Secondary diagnoses: asthma, cervical dysplasia, herpes simplex 2, migraines, stress fracture  History and Physical: For full details, please see admission history and physical. Briefly, Anne Mann is a 54 y.o. year old patient referred by Dr. Vernie Ammonsttelin for further eval and management of a left UPJ obstruction. patient's symptoms had been present for the past several years. They have progressively gotten worse. Initially, the patient had some left upper quadrant pain intermittently with associated nausea. The patient then noted that several times a week she would have intense left flank pain with a feeling of being stabbed slowly resolved. This typically occurred in early afternoon after lunch. She has not noted any specific trigger of her pain. She denies any hematuria. She denies any dysuria. She has not had kidney stone or recurrent urinary tract infections. She has no GU surgical history. She had anesthesia one time before for jaw surgery. 25 years prior. The patient typically is in good shape, runs quite a bit. She recently had a right ankle injury. She has no history of diabetes or coronary artery disease. She does have a mild murmur which has been evaluated and is clinically insignificant.  The patient underwent a CT scan of her symptoms and was found to have left hydronephrosis with cortical thinning and evidence of a early branching artery and evidence of a lower pole crossing vessel at the level of her obstruction.  She then underwent a renogram demonstrating 28% differential function of the left kidney with a T1 half that was not achieved. This is evidence of severe obstruction.   Hospital Course:  Pt was admitted and taken to the OR on 05/16/15 for cystoscopy, left retrograde pyelography with interpretation, left ureteral stent placement,  and left  robotic-assisted laparoscopic dismembered pyeloplasty.  Pt tolerated the procedure well and hemodynamically stable immediately post op.  She was extubated without complication and woke up from anesthesia neurologically intact. She was transferred from the OR to PACU and then to the floor without issue.  Her post op course progressed as expected.  By POD 1 she was ambulating and using the IS.  She was able to void after the foley was removed and was passing flatus.  JP Cr was consistent with serum and the drain was removed on POD 1.  She was tolerating her diet and was felt stable for d/c home on POD 1.   Laboratory values:  Lab Results  Component Value Date   WBC 5.2 05/08/2015   HGB 11.7* 05/17/2015   HCT 35.2* 05/17/2015   MCV 90.3 05/08/2015   PLT 195 05/08/2015   Lab Results  Component Value Date   CREATININE 0.83 05/17/2015    Disposition: Home  Discharge instruction: The patient was instructed to be ambulatory but to refrain from heavy lifting, strenuous activity, or driving.   Discharge medications:     Medication List    STOP taking these medications        ibuprofen 200 MG tablet  Commonly known as:  ADVIL,MOTRIN     valACYclovir 500 MG tablet  Commonly known as:  VALTREX      TAKE these medications        acetaminophen 500 MG tablet  Commonly known as:  TYLENOL  Take 500-1,000 mg by mouth every 6 (six) hours as needed for headache.     estradiol 0.075 MG/24HR  Commonly known as:  VIVELLE-DOT  APPLY 1 PATCH TOPICALLY TWICE WEEKLY AS DIRECTED     HYDROcodone-acetaminophen 5-325 MG tablet  Commonly known as:  NORCO  Take 1-2 tablets by mouth every 6 (six) hours as needed.     loratadine 10 MG tablet  Commonly known as:  CLARITIN  Take 10 mg by mouth daily.     montelukast 10 MG tablet  Commonly known as:  SINGULAIR  Take 10 mg by mouth at bedtime.     PROVENTIL HFA 108 (90 BASE) MCG/ACT inhaler  Generic drug:  albuterol  Inhale 2 puffs into the lungs  every 4 (four) hours as needed for wheezing.     SYMBICORT 160-4.5 MCG/ACT inhaler  Generic drug:  budesonide-formoterol  Inhale 2 puffs into the lungs 2 (two) times daily.       Followup:  Follow-up Information    Follow up with Crist Fat, MD On 05/23/2015.   Specialty:  Urology   Why:  3:30p   Contact information:   [address] [place] [address] [phone]       Follow up with Crist Fat, MD.   Specialty:  Urology   Contact information:   [address] [employer] [place] [address] [phone]

## 2015-11-03 ENCOUNTER — Telehealth: Payer: Self-pay | Admitting: *Deleted

## 2015-11-03 MED ORDER — ESTRADIOL 0.075 MG/24HR TD PTTW: MEDICATED_PATCH | TRANSDERMAL | Status: DC

## 2015-11-03 NOTE — Telephone Encounter (Signed)
Pt called requesting 90 day supply on vivelle-dot patch 0.075 mg, Rx sent

## 2015-11-07 ENCOUNTER — Telehealth: Payer: Self-pay | Admitting: *Deleted

## 2015-11-07 NOTE — Telephone Encounter (Signed)
PA done online for estradiol (vivelle-dot) 0.075 mg patch will wait for response.

## 2015-11-10 ENCOUNTER — Other Ambulatory Visit: Payer: Self-pay

## 2015-11-10 MED ORDER — ESTRADIOL 0.075 MG/24HR TD PTTW: MEDICATED_PATCH | TRANSDERMAL | Status: DC

## 2015-11-11 NOTE — Telephone Encounter (Signed)
Medication is covered and doesn't need PA.

## 2015-11-18 ENCOUNTER — Encounter: Payer: Self-pay | Admitting: Gynecology

## 2015-11-20 ENCOUNTER — Telehealth: Payer: Self-pay

## 2015-11-20 MED ORDER — VALACYCLOVIR HCL 500 MG PO TABS: 500.0000 mg | ORAL_TABLET | Freq: Every day | ORAL | Status: DC

## 2015-11-20 NOTE — Telephone Encounter (Signed)
Okay, valtrex 500 mg daily #90 with 4 refills

## 2015-11-20 NOTE — Telephone Encounter (Signed)
Patient has no refills left on her Valtrex.  She said she has is having her 2nd outbreaks in the last few months and would like to go on daily Valtrex to prevent future outbreaks .

## 2016-01-23 ENCOUNTER — Telehealth: Payer: Self-pay | Admitting: *Deleted

## 2016-01-23 MED ORDER — VALACYCLOVIR HCL 500 MG PO TABS: ORAL_TABLET | ORAL | Status: DC

## 2016-01-23 NOTE — Telephone Encounter (Signed)
(  You are the back up MD) Pt is taking Valtrex 500 mg 1 po daily states yesterday she had outbreak, still has today. Pt asked what can she do to help with this? Pt is leaving out of town this weekend. Please advise

## 2016-01-23 NOTE — Telephone Encounter (Signed)
Increase dose to 3 times daily for the next 5 days

## 2016-01-23 NOTE — Telephone Encounter (Signed)
Pt aware, Rx sent. 

## 2016-04-15 ENCOUNTER — Other Ambulatory Visit: Payer: Self-pay | Admitting: Gynecology

## 2016-04-29 ENCOUNTER — Encounter: Payer: Self-pay | Admitting: Gynecology

## 2016-04-29 ENCOUNTER — Ambulatory Visit (INDEPENDENT_AMBULATORY_CARE_PROVIDER_SITE_OTHER): Payer: BLUE CROSS/BLUE SHIELD | Admitting: Gynecology

## 2016-04-29 VITALS — BP 120/76 | Ht 64.0 in | Wt 145.0 lb

## 2016-04-29 DIAGNOSIS — Z30431 Encounter for routine checking of intrauterine contraceptive device: Secondary | ICD-10-CM

## 2016-04-29 DIAGNOSIS — A6000 Herpesviral infection of urogenital system, unspecified: Secondary | ICD-10-CM

## 2016-04-29 DIAGNOSIS — Z01419 Encounter for gynecological examination (general) (routine) without abnormal findings: Secondary | ICD-10-CM

## 2016-04-29 DIAGNOSIS — Z79890 Hormone replacement therapy (postmenopausal): Secondary | ICD-10-CM

## 2016-04-29 MED ORDER — ESTRADIOL 0.075 MG/24HR TD PTTW: 1.0000 | MEDICATED_PATCH | TRANSDERMAL | 4 refills | Status: DC

## 2016-04-29 MED ORDER — VALACYCLOVIR HCL 500 MG PO TABS: ORAL_TABLET | ORAL | 4 refills | Status: DC

## 2016-04-29 NOTE — Patient Instructions (Signed)

## 2016-04-29 NOTE — Progress Notes (Signed)
    Anne Mann 02/07/1961 [id number]        55 y.o.  [id number]  for annual exam.  Several issues noted below  Past medical history,surgical history, problem list, medications, allergies, family history and social history were all reviewed and documented as reviewed in the EPIC chart.  ROS:  Performed with pertinent positives and negatives included in the history, assessment and plan.   Additional significant findings :  None   Exam: Kennon PortelaKim Gardner assistant Vitals:   04/29/16 1558  BP: 120/76  Weight: 145 lb (65.8 kg)  Height: 5\' 4"  (1.626 m)   Body mass index is 24.89 kg/m.  General appearance:  Normal affect, orientation and appearance. Skin: Grossly normal HEENT: Without gross lesions.  No cervical or supraclavicular adenopathy. Thyroid normal.  Lungs:  Clear without wheezing, rales or rhonchi Cardiac: RR, without RMG Abdominal:  Soft, nontender, without masses, guarding, rebound, organomegaly or hernia Breasts:  Examined lying and sitting without masses, retractions, discharge or axillary adenopathy. Pelvic:  Ext, BUS, Vagina Normal  Cervix Normal  Uterus anteverted, normal size, shape and contour, midline and mobile nontender   Adnexa without masses or tenderness    Anus and perineum normal   Rectovaginal normal sphincter tone without palpated masses or tenderness.    Assessment/Plan:  55 y.o. [id number] female for annual exam.   1. Postmenopausal/HRT. Continues on Vivelle-Dot 0.075 using her Mirena IUD for endometrial protection. I reviewed the most current 2017 NAMS guidelines on hormone replacement therapy. I discussed possible benefits when started early from a cardiovascular and bone health as well as symptom relief versus risks to include thrombosis and possible breast cancer. The offbrand labeling of using the Mirena IUD for endometrial protection was also discussed. Patient's comfortable continuing and I refilled her 1 year. Patient notes report any vaginal  bleeding. 2. HSV. Has had 2 genital outbreaks this past year. Currently taking Valtrex 500 mg daily for suppression. Refill 1 year provided. 3. Pap smear/HPV 2013 negative. No Pap smear done this year. Will plan repeat next year at 5 year interval per current screening guidelines. Does have history of LEEP 1997 for LGSIL with normal Pap smears afterwards. 4. Mammography 11/2015. Continue with annual mammography when due. SBE monthly reviewed. 5. Colonoscopy 2012. Discussed with her gastroenterologist who recommended repeating at age 55. 6. Health maintenance. No baseline labs done as patient reports this done elsewhere. Follow up 1 year, sooner as needed.   [name],Chi Garlow P MD, 4:31 PM 04/29/2016

## 2016-11-01 ENCOUNTER — Encounter: Payer: Self-pay | Admitting: Gynecology

## 2017-04-22 ENCOUNTER — Encounter: Payer: Self-pay | Admitting: Nurse Practitioner

## 2017-04-22 ENCOUNTER — Ambulatory Visit (INDEPENDENT_AMBULATORY_CARE_PROVIDER_SITE_OTHER): Payer: BLUE CROSS/BLUE SHIELD | Admitting: Nurse Practitioner

## 2017-04-22 VITALS — BP 100/64 | HR 74 | Ht 64.0 in | Wt 145.0 lb

## 2017-04-22 DIAGNOSIS — R1012 Left upper quadrant pain: Secondary | ICD-10-CM

## 2017-04-22 DIAGNOSIS — G8929 Other chronic pain: Secondary | ICD-10-CM | POA: Diagnosis not present

## 2017-04-22 NOTE — Progress Notes (Signed)
     HPI: Patient is a 56 year old female known to Dr. Christella Hartigan, he saw her in July 2016 for evaluation of LUQ pain. Patient says she was subsequently diagnosed with UPJ obstruction, underwent surgery and pain resolved. She has had LUQ  pain present since college but it is different than what she saw [place] for in 2016. She was diagnosed with a hiatal hernia in college, took Zantac for long time but then did well off medication for years. She developed the same LUQ pain around Christmas of last year and has been back on daily Zantac since. The Zantac helps but occasionally she requires a second dose during during the day. She avoids fatty and spicy foods. Tomato based foods can bother her as well. She gets temporary relief with belching. Sometimes repositioning herself helps as well. No associated nausea. She has never had any associated weight loss. Her bowel movements for the most part are okay. She has no heartburn. Recently, while in [place] she did feel some discomfort in her lower chest which caused some anxiety about whether it could be cardiac. She has no shortness of breath. No dysphagia or odynophagia.    Past Medical History:  Diagnosis Date  . Asthma    EXERCISE INDUCED  . Cervical dysplasia 1997  . Complication of anesthesia   . Heart murmur   . HSV-2 infection   . Migraines   . PONV (postoperative nausea and vomiting)   . Stress fracture   . UPJ (ureteropelvic junction) obstruction   . Ureteropelvic junction obstruction    LEFT    Patient's surgical history, family medical history, social history, medications and allergies were all reviewed in Epic    Physical Exam: BP 100/64   Pulse 74   Ht  (1.626 m)   Wt 145 lb (65.8 kg)   BMI 24.89 kg/m   GENERAL:  Well developed white female in NAD PSYCH: :Pleasant, cooperative, normal affect EENT:  conjunctiva pink, mucous membranes moist, neck supple without masses CARDIAC:  RRR, no murmur heard, no peripheral edema PULM:  Normal respiratory effort, lungs CTA bilaterally, no wheezing ABDOMEN:  Nondistended, soft, nontender. No obvious masses, no hepatomegaly,  normal bowel sounds SKIN:  turgor, no lesions seen Musculoskeletal:  Normal muscle tone, normal strength NEURO: Alert and oriented x 3, no focal neurologic deficits    ASSESSMENT and PLAN:   Pleasant 56 year old with chronic (years)  LUQ pain. Suspect a musculoskeletal component to the pain but it definitely has a GI component as well.  -We discussed upper endoscopy, I told her that I didn't think it would give [place] an answer but that it wasn't unreasonable to proceed. The other option is a short trial of anti-inflammatories / muscle relaxer. She would like to proceed with and EGD The risks and benefits of EGD were discussed and the patient agrees to proceed.    Colon cancer screening . Due for recall in 2021.    Willette Cluster , NP 04/22/2017, 8:48 AM

## 2017-04-22 NOTE — Patient Instructions (Signed)
If you are age 54 or older, your body mass index should be between 23-30. Your Body mass index is 24.89 kg/m. If this is out of the aforementioned range listed, please consider follow up with your Primary Care Provider.  If you are age 21 or younger, your body mass index should be between 19-25. Your Body mass index is 24.89 kg/m. If this is out of the aformentioned range listed, please consider follow up with your Primary Care Provider.   You have been scheduled for an endoscopy. Please follow written instructions given to you at your visit today. If you use inhalers (even only as needed), please bring them with you on the day of your procedure. Your physician has requested that you go to www.startemmi.com and enter the access code given to you at your visit today. This web site gives a general overview about your procedure. However, you should still follow specific instructions given to you by our office regarding your preparation for the procedure.  Thank you for choosing me and [facility].   Willette Cluster, NP

## 2017-04-26 NOTE — Progress Notes (Signed)
I agree with the above note, plan 

## 2017-05-03 ENCOUNTER — Encounter: Payer: Self-pay | Admitting: Gynecology

## 2017-05-03 ENCOUNTER — Ambulatory Visit (INDEPENDENT_AMBULATORY_CARE_PROVIDER_SITE_OTHER): Payer: BLUE CROSS/BLUE SHIELD | Admitting: Gynecology

## 2017-05-03 VITALS — BP 120/74 | Ht 64.0 in | Wt 146.0 lb

## 2017-05-03 DIAGNOSIS — Z01411 Encounter for gynecological examination (general) (routine) with abnormal findings: Secondary | ICD-10-CM | POA: Diagnosis not present

## 2017-05-03 DIAGNOSIS — N952 Postmenopausal atrophic vaginitis: Secondary | ICD-10-CM | POA: Diagnosis not present

## 2017-05-03 DIAGNOSIS — Z1151 Encounter for screening for human papillomavirus (HPV): Secondary | ICD-10-CM | POA: Diagnosis not present

## 2017-05-03 MED ORDER — ESTRADIOL 0.075 MG/24HR TD PTTW: 1.0000 | MEDICATED_PATCH | TRANSDERMAL | 4 refills | Status: DC

## 2017-05-03 MED ORDER — VALACYCLOVIR HCL 500 MG PO TABS: ORAL_TABLET | ORAL | 4 refills | Status: DC

## 2017-05-03 NOTE — Patient Instructions (Signed)
Follow up in one year, sooner as needed. 

## 2017-05-03 NOTE — Progress Notes (Signed)
    Anne Mann 14-Sep-1960 [id number]        56 y.o.  [id number] for annual gynecologic exam.    Past medical history,surgical history, problem list, medications, allergies, family history and social history were all reviewed and documented as reviewed in the EPIC chart.  ROS:  Performed with pertinent positives and negatives included in the history, assessment and plan.   Additional significant findings :  None   Exam: Kennon Portela assistant Vitals:   05/03/17 1554  BP: 120/74  Weight: 146 lb (66.2 kg)  Height:  (1.626 m)   Body mass index is 25.06 kg/m.  General appearance:  Normal affect, orientation and appearance. Skin: Grossly normal HEENT: Without gross lesions.  No cervical or supraclavicular adenopathy. Thyroid normal.  Lungs:  Clear without wheezing, rales or rhonchi Cardiac: RR, without RMG Abdominal:  Soft, nontender, without masses, guarding, rebound, organomegaly or hernia Breasts:  Examined lying and sitting without masses, retractions, discharge or axillary adenopathy. Pelvic:  Ext, BUS, Vagina: Normal with mild atrophic changes  Cervix: Normal. IUD string visualized. Pap smear/HPV  Uterus: Anteverted, normal size, shape and contour, midline and mobile nontender   Adnexa: Without masses or tenderness    Anus and perineum: Normal   Rectovaginal: Normal sphincter tone without palpated masses or tenderness.    Assessment/Plan:  56 y.o. [id number] female for annual gynecologic exam.   1. Postmenopausal/atrophic changes/HRT. Continues on Vivelle 0.075 in using her Mirena IUD for endometrial protection. Mirena placed 06/2014. No bleeding. I again reviewed the most current studies and issues with HRT to include increased risk of harm versus such as stroke heart attack DVT a possible breast cancer issue. Offbrand labeling using Mirena for endometrial protection also reviewed. Benefits to include symptom relief cardiovascular bone health also discussed. At this point  patient wants to continue. Refill 1 year provided. 2. HSV. Occasional outbreaks. Uses Valtrex 500 mg daily with increase this to twice daily during outbreaks. #120 with 4 refills provided. 3. Mammography 10/2016. Continue with annual mammography when due. SBE monthly reviewed. Breast exam normal today. 4. Colonoscopy 2012. Repeat at their recommended interval. 5. Pap smear/HPV 2014. Pap smear/HPV today. 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 6. DEXA never. Will plan further into the menopause. 7. Health maintenance. No routine lab work done as patient does this elsewhere. Follow up 1 year, sooner as needed.   [name] MD, 4:33 PM 05/03/2017

## 2017-05-04 LAB — PAP IG AND HPV HIGH-RISK: HPV DNA High Risk: NOT DETECTED

## 2017-05-05 ENCOUNTER — Other Ambulatory Visit: Payer: Self-pay | Admitting: Gynecology

## 2017-05-05 MED ORDER — FLUCONAZOLE 150 MG PO TABS: 150.0000 mg | ORAL_TABLET | Freq: Once | ORAL | 0 refills | Status: AC

## 2017-05-20 ENCOUNTER — Encounter: Payer: Self-pay | Admitting: Gastroenterology

## 2017-06-03 ENCOUNTER — Encounter: Payer: BLUE CROSS/BLUE SHIELD | Admitting: Gastroenterology

## 2017-07-19 ENCOUNTER — Ambulatory Visit
Admission: RE | Admit: 2017-07-19 | Discharge: 2017-07-19 | Disposition: A | Discharge status: Home / Self Care | Payer: BLUE CROSS/BLUE SHIELD | Source: Ambulatory Visit | Attending: Sports Medicine | Admitting: Sports Medicine

## 2017-07-19 ENCOUNTER — Ambulatory Visit: Payer: BLUE CROSS/BLUE SHIELD | Admitting: Sports Medicine

## 2017-07-19 VITALS — BP 104/74 | Ht 64.0 in | Wt 140.0 lb

## 2017-07-19 DIAGNOSIS — M79672 Pain in left foot: Secondary | ICD-10-CM | POA: Diagnosis not present

## 2017-07-19 NOTE — Assessment & Plan Note (Signed)
Point tenderness over L cuboid with h/o stress fracture 1.5years ago with persistent pain since that time. Given length or time without resolution of pain, start with xrays and schedule MRI L foot with gel marker.

## 2017-07-19 NOTE — Progress Notes (Signed)
HPI  CC: L foot pain   Patient is a 56 yo woman who presents with chronic L foot pain. She had previously been diagnosed with L cuboid stress fracture without injury/trauma about 1.5 years ago at a different office by a Dr. Charlett BlakeVoytek who had seen on xray and placed her in a boot for 3-4 weeks followed up by lace up sneakers. Patient states that her pain never resolved and persisted. She has noticed the pain more over the last 2 weeks without any increase in her physical activity or new injury/trauma. She has the most difficulty on weight bearing exercises on the ball of her L foot and has had to stop running and has to turn her foot going down stairs. No popping or numbness.  Medications/Interventions Tried: none  See HPI and/or previous note for associated ROS.  Objective: BP 104/74   Ht 5\' 4"  (1.626 m)   Wt 140 lb (63.5 kg)   BMI 24.03 kg/m   Awake alert and oriented 3. Sitting comfortably in the exam room. Well-developed, well-nourished. MSK: L foot with point tenderness over cuboid. No visible erythema, swelling, ecchymosis, or bony deformity. Full active ROM in all directions. Strength 5/5 but with some pain on resisting extension. Good pulses. Sensation is intact to light-touch. Walking without a noticeable limp.  Assessment and plan:  Left foot pain Point tenderness over L cuboid with h/o stress fracture 1.5years ago with persistent pain since that time. Given length or time without resolution of pain, start with xrays and schedule MRI L foot with gel marker.   Orders Placed This Encounter  Procedures  . DG Foot Complete Left    Standing Status:   Future    Standing Expiration Date:   09/19/2018    Order Specific Question:   Reason for Exam (SYMPTOM  OR DIAGNOSIS REQUIRED)    Answer:   left foot pain; AP, lateral and oblique views    Order Specific Question:   Is patient pregnant?    Answer:   No    Order Specific Question:   Preferred imaging location?    Answer:    [facility]    Order Specific Question:   Radiology Contrast Protocol - do NOT remove file path    Answer:   [email or web address]  . MR FOOT LEFT WO CONTRAST    Standing Status:   Future    Standing Expiration Date:   09/19/2018    Order Specific Question:   What is the patient's sedation requirement?    Answer:   No Sedation    Order Specific Question:   Does the patient have a pacemaker or implanted devices?    Answer:   No    Order Specific Question:   Preferred imaging location?    Answer:   [facility] (table limit-550lbs)    Order Specific Question:   Radiology Contrast Protocol - do NOT remove file path    Answer:   [email or web address]    Order Specific Question:   Reason for Exam additional comments    Answer:   place gel marker at the point of tenderness; rule out stress fracture    Order Specific Question:   Is patient pregnant?    Answer:   No    Leland HerElsia J Rene Gonsoulin, DO PGY-2, [facility] 07/19/2017 12:08 PM   Patient seen and evaluated with the resident. I agree with the above plan of care. Patient was previously diagnosed with a stress fracture of her  cuboid in her left foot. Her symptoms never resolved. She still has pain and intermittent swelling in this area. X-rays today were reviewed and they are unremarkable. We will need to proceed with an MRI to evaluate further, specifically to rule out a chronic stress injury. Phone follow-up with those results when available. We will delineate treatment based on those findings.

## 2017-08-03 ENCOUNTER — Inpatient Hospital Stay: Admission: RE | Admit: 2017-08-03 | Payer: BLUE CROSS/BLUE SHIELD | Source: Ambulatory Visit

## 2017-08-08 ENCOUNTER — Ambulatory Visit
Admission: RE | Admit: 2017-08-08 | Discharge: 2017-08-08 | Disposition: A | Discharge status: Home / Self Care | Payer: Commercial Managed Care - PPO | Source: Ambulatory Visit | Attending: Sports Medicine | Admitting: Sports Medicine

## 2017-08-08 DIAGNOSIS — M79672 Pain in left foot: Secondary | ICD-10-CM

## 2017-08-10 ENCOUNTER — Telehealth: Payer: Self-pay | Admitting: Sports Medicine

## 2017-08-10 NOTE — Telephone Encounter (Signed)
  I spoke with Anne Mann on the phone today after reviewing the MRI of her left foot. There is no evidence of stress reaction or stress fracture. Her previous stress fracture has healed. She does have some moderate degenerative changes at the first MTP joint as well some edema around a bipartite tibial sesamoid. Some mild degenerative changes at the Lisfranc joint. Based on these findings, I recommend that she return to the office so that I can evaluate her orthotics. We may need to make her a new pair of custom orthotics. She is reassured that she may continue with activity as tolerated using pain as her guide.

## 2017-08-10 NOTE — Telephone Encounter (Signed)
Opened by accident

## 2017-08-12 ENCOUNTER — Ambulatory Visit: Payer: Commercial Managed Care - PPO | Admitting: Sports Medicine

## 2017-08-12 ENCOUNTER — Encounter: Payer: Self-pay | Admitting: Sports Medicine

## 2017-08-12 VITALS — BP 110/78 | Ht 64.0 in | Wt 140.0 lb

## 2017-08-12 DIAGNOSIS — M79672 Pain in left foot: Secondary | ICD-10-CM | POA: Diagnosis not present

## 2017-08-12 DIAGNOSIS — Q741 Congenital malformation of knee: Secondary | ICD-10-CM | POA: Diagnosis not present

## 2017-08-12 DIAGNOSIS — R269 Unspecified abnormalities of gait and mobility: Secondary | ICD-10-CM

## 2017-08-12 NOTE — Progress Notes (Signed)
Chief complaint: Follow-up of left lateral foot pain 1.5 years, making of orthotics  History of present illness: Anne Mann is a 57 year old female presents to the sports medicine office today for follow-up of left lateral foot pain. In brief review, she has had previous history of left cuboid stress fracture, with symptoms never really resolving. She has had intermittent pain and swelling in this area. X-rays did not show any acute bony abnormality, did show some first MTP osteoarthritic changes. MRI was done or days ago back on 08/08/17, showed redemonstration of moderate degenerative changes of the first MTP with bone marrow edema and a bipartite tibial sesamoid of the first metatarsal, consistent with sesamoid dysfunction. She was also found to have slight degenerative changes at the Lisfranc joint. No focal soft tissue abnormalities were seen in the lateral aspect of the left foot where she had pain. Today, she reports occasionally having some pain, mainly when she walks for prolonged period of time. She does have an old pair of orthotics that are about 55 years old. She does not report of any interval injury or trauma. No numbness, tingling, or burning paresthesias.  Review of systems:  As stated above  Interval past medical history, surgical history, family history, and social history obtained and unchanged.  Physical exam: Vital signs are reviewed and are documented in the chart Gen.: Alert, oriented, appears stated age, in no apparent distress HEENT: Moist oral mucosa Respiratory: Normal respirations, able to speak in full sentences Cardiac: Regular rate, distal pulses 2+ Integumentary: No rashes on visible skin:  Neurologic: No focal deficits Psych: Normal affect, mood is described as good Musculoskeletal: Inspection of the foot reveals no obvious deformity or muscle atrophy, no warmth, erythema, ecchymosis, or effusion, no tenderness to palpation over the first MTP or Lisfranc, no  tenderness over the base of fifth metatarsal, upon gait evaluation, with walking she does land with slight supination, she does have bilateral genu valgum of the knees that are more prominent with light jogging, with more dynamic pronation when running  Assessment and plan: 1. Left lateral foot pain, suspect from biomechanical errors with bilateral genu valgum causing increased stress the lateral portion of her left foot 2. Asymptomatic left first MTP osteoarthritis  Patient was fitted for a standard, cushioned, semi-rigid orthotic. The orthotic was heated and afterward the patient stood on the orthotic blank positioned on the orthotic stand. The patient was positioned in subtalar neutral position and 10 degrees of ankle dorsiflexion in a weight bearing stance. After completion of molding, a stable base was applied to the orthotic blank. The blank was ground to a stable position for weight bearing. Size: 6 Base: Blue EVA Additional Posting and Padding: None The patient ambulated these and were fitted to her satisfaction. I spent 30 minutes with this patient, greater than 50% was face-to-face time counseling regarding the below diagnosis, preparing, and making of the orthotics.  Orthotics were made for her as above. This will hopefully help out with the pain that she is having. Discussed MRI did not show any soft tissue abnormality or any bony abnormality that would explain otherwise why she is having left lateral foot pain. Reassured her that it does not appear to be any complications from her cuboid stress fracture. Only thing MRI, and her previous x-ray of her left foot, showed was first MTP osteoarthritis with some degenerative changes seen also at the Lisfranc region, areas where she is not having any pain today. Discussed physical therapy exercises for her to do  at home with her quadriceps, hamstring, and hip abductors. If she would like to do formal physical therapy in the future, discussed  to have her give [place] a call and let us know. Otherwise, we'll have her return on an as-needed basis.   Haynes Kerns, M.D. Primary Care Sports Medicine Fellow [facility]

## 2017-11-18 DIAGNOSIS — Z1231 Encounter for screening mammogram for malignant neoplasm of breast: Secondary | ICD-10-CM | POA: Diagnosis not present

## 2017-11-21 ENCOUNTER — Encounter: Payer: Self-pay | Admitting: Gynecology

## 2017-11-28 ENCOUNTER — Encounter: Payer: Self-pay | Admitting: Gynecology

## 2017-11-28 DIAGNOSIS — R922 Inconclusive mammogram: Secondary | ICD-10-CM | POA: Diagnosis not present

## 2017-11-28 DIAGNOSIS — N6001 Solitary cyst of right breast: Secondary | ICD-10-CM | POA: Diagnosis not present

## 2017-12-02 DIAGNOSIS — J4521 Mild intermittent asthma with (acute) exacerbation: Secondary | ICD-10-CM | POA: Diagnosis not present

## 2018-03-06 DIAGNOSIS — Z0000 Encounter for general adult medical examination without abnormal findings: Secondary | ICD-10-CM | POA: Diagnosis not present

## 2018-03-06 DIAGNOSIS — G43909 Migraine, unspecified, not intractable, without status migrainosus: Secondary | ICD-10-CM | POA: Diagnosis not present

## 2018-03-06 DIAGNOSIS — J4530 Mild persistent asthma, uncomplicated: Secondary | ICD-10-CM | POA: Diagnosis not present

## 2018-03-06 DIAGNOSIS — J309 Allergic rhinitis, unspecified: Secondary | ICD-10-CM | POA: Diagnosis not present

## 2018-03-06 DIAGNOSIS — E7800 Pure hypercholesterolemia, unspecified: Secondary | ICD-10-CM | POA: Diagnosis not present

## 2018-04-25 DIAGNOSIS — R1013 Epigastric pain: Secondary | ICD-10-CM | POA: Diagnosis not present

## 2018-05-04 ENCOUNTER — Encounter: Payer: Self-pay | Admitting: Gynecology

## 2018-05-04 ENCOUNTER — Ambulatory Visit (INDEPENDENT_AMBULATORY_CARE_PROVIDER_SITE_OTHER): Payer: Commercial Managed Care - PPO | Admitting: Gynecology

## 2018-05-04 VITALS — BP 118/76 | Ht 64.0 in | Wt 147.0 lb

## 2018-05-04 DIAGNOSIS — N952 Postmenopausal atrophic vaginitis: Secondary | ICD-10-CM | POA: Diagnosis not present

## 2018-05-04 DIAGNOSIS — Z30431 Encounter for routine checking of intrauterine contraceptive device: Secondary | ICD-10-CM

## 2018-05-04 DIAGNOSIS — Z79890 Hormone replacement therapy (postmenopausal): Secondary | ICD-10-CM

## 2018-05-04 DIAGNOSIS — Z01419 Encounter for gynecological examination (general) (routine) without abnormal findings: Secondary | ICD-10-CM

## 2018-05-04 MED ORDER — ESTRADIOL 0.075 MG/24HR TD PTTW
1.0000 | MEDICATED_PATCH | TRANSDERMAL | 4 refills | Status: DC
Start: 2018-05-04 — End: 2019-05-07

## 2018-05-04 MED ORDER — VALACYCLOVIR HCL 500 MG PO TABS: ORAL_TABLET | ORAL | 4 refills | Status: DC

## 2018-05-04 NOTE — Patient Instructions (Signed)
Follow-up in 1 year for annual exam 

## 2018-05-04 NOTE — Progress Notes (Signed)
    Anne Mann 1960/12/09 [id number]        57 y.o.  [id number] for annual gynecologic exam.  Doing well without gynecologic complaints.  Past medical history,surgical history, problem list, medications, allergies, family history and social history were all reviewed and documented as reviewed in the EPIC chart.  ROS:  Performed with pertinent positives and negatives included in the history, assessment and plan.   Additional significant findings : None   Exam: Kennon Portela assistant Vitals:   05/04/18 1555  BP: 118/76  Weight: 147 lb (66.7 kg)  Height: 5\' 4"  (1.626 m)   Body mass index is 25.23 kg/m.  General appearance:  Normal affect, orientation and appearance. Skin: Grossly normal HEENT: Without gross lesions.  No cervical or supraclavicular adenopathy. Thyroid normal.  Lungs:  Clear without wheezing, rales or rhonchi Cardiac: RR, without RMG Abdominal:  Soft, nontender, without masses, guarding, rebound, organomegaly or hernia Breasts:  Examined lying and sitting without masses, retractions, discharge or axillary adenopathy. Pelvic:  Ext, BUS, Vagina: Normal with mild atrophic changes  Cervix: Normal with IUD string visualized  Uterus: Diverted, normal size, shape and contour, midline and mobile nontender   Adnexa: Without masses or tenderness    Anus and perineum: Normal   Rectovaginal: Normal sphincter tone without palpated masses or tenderness.    Assessment/Plan:  57 y.o. [id number] female for annual gynecologic exam.   1. Postmenopausal/atrophic genital changes.  Continues on Vivelle 0.075 mg patch using her Mirena IUD for endometrial protection.  We again reviewed the risks versus benefits of HRT and the off brand labeling use of Mirena IUD for endometrial protection.  Symptom relief as well as cardiovascular and bone health versus thrombosis such as stroke heart attack DVT in the breast cancer issue reviewed.  At this point the patient is comfortable continuing and I  refilled her x1 year. 2. HSV.  Occasional outbreaks.  Uses Valtrex 500 mg daily.  Refill x1 year provided. 3. Mammography 10/2017.  Continue with annual mammography when due.  Breast exam normal today. 4. Pap smear/HPV 2018.  No Pap smear done today.  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  Plan repeat Pap smear/HPV at 5-year interval per current screening guidelines. 5. DEXA never.  Will plan further into the menopause. 6. Health maintenance.  No routine lab work done as patient reports this done elsewhere.  Follow-up 1 year, sooner as needed.   [name] MD, 4:39 PM 05/04/2018

## 2018-05-19 DIAGNOSIS — K3189 Other diseases of stomach and duodenum: Secondary | ICD-10-CM | POA: Diagnosis not present

## 2018-05-19 DIAGNOSIS — K228 Other specified diseases of esophagus: Secondary | ICD-10-CM | POA: Diagnosis not present

## 2018-05-19 DIAGNOSIS — K2930 Chronic superficial gastritis without bleeding: Secondary | ICD-10-CM | POA: Diagnosis not present

## 2018-05-19 DIAGNOSIS — R1013 Epigastric pain: Secondary | ICD-10-CM | POA: Diagnosis not present

## 2018-07-20 DIAGNOSIS — K2950 Unspecified chronic gastritis without bleeding: Secondary | ICD-10-CM | POA: Diagnosis not present

## 2018-07-20 DIAGNOSIS — J329 Chronic sinusitis, unspecified: Secondary | ICD-10-CM | POA: Diagnosis not present

## 2018-09-06 DIAGNOSIS — J4530 Mild persistent asthma, uncomplicated: Secondary | ICD-10-CM | POA: Diagnosis not present

## 2018-09-28 DIAGNOSIS — B029 Zoster without complications: Secondary | ICD-10-CM | POA: Diagnosis not present

## 2019-04-23 ENCOUNTER — Encounter (HOSPITAL_COMMUNITY): Payer: Self-pay | Admitting: Emergency Medicine

## 2019-04-23 ENCOUNTER — Other Ambulatory Visit: Payer: Self-pay

## 2019-04-23 DIAGNOSIS — S81012A Laceration without foreign body, left knee, initial encounter: Secondary | ICD-10-CM | POA: Diagnosis not present

## 2019-04-23 DIAGNOSIS — W010XXA Fall on same level from slipping, tripping and stumbling without subsequent striking against object, initial encounter: Secondary | ICD-10-CM | POA: Diagnosis not present

## 2019-04-23 DIAGNOSIS — Y9302 Activity, running: Secondary | ICD-10-CM | POA: Insufficient documentation

## 2019-04-23 DIAGNOSIS — S0083XA Contusion of other part of head, initial encounter: Secondary | ICD-10-CM | POA: Diagnosis not present

## 2019-04-23 DIAGNOSIS — Y929 Unspecified place or not applicable: Secondary | ICD-10-CM | POA: Insufficient documentation

## 2019-04-23 DIAGNOSIS — Y999 Unspecified external cause status: Secondary | ICD-10-CM | POA: Insufficient documentation

## 2019-04-23 DIAGNOSIS — S0990XA Unspecified injury of head, initial encounter: Secondary | ICD-10-CM | POA: Diagnosis present

## 2019-04-23 NOTE — ED Triage Notes (Signed)
Pt reports being out running and tripped falling landing on left knee and hitting left forehead. Pt denies any LOC. Wound is currently dressed at this time with no bleeding through dressing.

## 2019-04-24 ENCOUNTER — Emergency Department (HOSPITAL_COMMUNITY)
Admission: EM | Admit: 2019-04-24 | Discharge: 2019-04-24 | Disposition: A | Discharge status: Home / Self Care | Payer: Commercial Managed Care - PPO | Attending: Emergency Medicine | Admitting: Emergency Medicine

## 2019-04-24 DIAGNOSIS — W19XXXA Unspecified fall, initial encounter: Secondary | ICD-10-CM

## 2019-04-24 DIAGNOSIS — S81012A Laceration without foreign body, left knee, initial encounter: Secondary | ICD-10-CM

## 2019-04-24 DIAGNOSIS — S0083XA Contusion of other part of head, initial encounter: Secondary | ICD-10-CM

## 2019-04-24 MED ORDER — HYDROCODONE-ACETAMINOPHEN 5-325 MG PO TABS: 1.0000 | ORAL_TABLET | ORAL | 0 refills | Status: DC | PRN

## 2019-04-24 MED ORDER — LIDOCAINE-EPINEPHRINE (PF) 2 %-1:200000 IJ SOLN
10.0000 mL | Freq: Once | INTRAMUSCULAR | Status: AC
  Administered 2019-04-24: 10 mL
  Filled 2019-04-24: qty 10

## 2019-04-24 MED ORDER — BACITRACIN-NEOMYCIN-POLYMYXIN OINTMENT TUBE
1.0000 "application " | TOPICAL_OINTMENT | Freq: Once | CUTANEOUS | Status: AC
  Administered 2019-04-24: 1 via TOPICAL
  Filled 2019-04-24: qty 1

## 2019-04-24 NOTE — Discharge Instructions (Signed)
Please call Dr. Tamala Julian to check on your tetanus status.   Sutures can be removed in 10 days. Go to your doctor (or return here) for recheck if there is any sign of infection - increasing pain or redness, drainage, expanding area of redness, fever.

## 2019-04-24 NOTE — ED Provider Notes (Signed)
[facility]-EMERGENCY DEPT Provider Note   CSN: [id number] Arrival date & time: 04/23/19  1841     History   Chief Complaint Chief Complaint  Patient presents with  . Fall  . Laceration    HPI Anne Mann is a 58 y.o. female.     Patient to ED for evaluation of injuries sustained after a fall while running on a paved but uneven trail surface. She fell around 4:00 pm yesterday afternoon (04/23/19). She hit her head/face, and has a laceration to her left knee. She did not pass out, and there has been no nausea or vomiting. She denies neck pain, chest or abdominal pain. She has been ambulatory without difficulty.   The history is provided by the patient. No language interpreter was used.    Past Medical History:  Diagnosis Date  . Asthma    EXERCISE INDUCED  . Cervical dysplasia 1997  . Complication of anesthesia   . Heart murmur   . HSV-2 infection   . Migraines   . PONV (postoperative nausea and vomiting)   . Stress fracture   . UPJ (ureteropelvic junction) obstruction   . Ureteropelvic junction obstruction    LEFT    Patient Active Problem List   Diagnosis Date Noted  . Left foot pain 07/19/2017  . Ureteropelvic junction (UPJ) obstruction 05/16/2015  . Migraines   . MUSCLE STRAIN, HAMSTRING MUSCLE 05/20/2010    Past Surgical History:  Procedure Laterality Date  . CERVICAL BIOPSY  W/ LOOP ELECTRODE EXCISION  1997   lgsil  . CYSTOSCOPY W/ RETROGRADES Left 05/16/2015   Procedure: CYSTOSCOPY WITH LEFT RETROGRADE PYELOGRAM, STENT PLACEMENT;  Surgeon: Crist Fat, MD;  Location: [facility];  Service: Urology;  Laterality: Left;  . Mirena     Inserted 06/11/2014  . ROBOT ASSISTED PYELOPLASTY Left 05/16/2015   Procedure: ROBOTIC ASSISTED LAPAROSCOPIC LEFT PYELOPLASTY;  Surgeon: Crist Fat, MD;  Location: [facility];  Service: Urology;  Laterality: Left;  . TMJ ARTHROSCOPY  1991     OB History    Gravida  3   Para  2   Term  2   Preterm      AB  1   Living  2     SAB  1   TAB      Ectopic      Multiple      Live Births               Home Medications    Prior to Admission medications   Medication Sig Start Date End Date Taking? Authorizing Provider  acetaminophen (TYLENOL) 500 MG tablet Take 500-1,000 mg by mouth every 6 (six) hours as needed for headache.    [provider]  estradiol (VIVELLE-DOT) 0.075 MG/24HR Place 1 patch onto the skin 2 (two) times a week. 05/04/18   Fontaine, Nadyne Coombes, MD  loratadine (CLARITIN) 10 MG tablet Take 10 mg by mouth daily.    [provider]  montelukast (SINGULAIR) 10 MG tablet Take 10 mg by mouth at bedtime.    [provider]  PROVENTIL HFA 108 (90 BASE) MCG/ACT inhaler Inhale 2 puffs into the lungs every 4 (four) hours as needed for wheezing.  01/24/14   [provider]  SYMBICORT 160-4.5 MCG/ACT inhaler Inhale 2 puffs into the lungs 2 (two) times daily.  05/13/14   [provider]  valACYclovir (VALTREX) 500 MG tablet Take one pill daily.  Increase to 2 pills daily with  outbreak 05/04/18   Fontaine, Belinda Block, MD    Family History Family History  Problem Relation Age of Onset  . Colon cancer Neg Hx   . Stomach cancer Neg Hx     Social History Social History   Tobacco Use  . Smoking status: Never Smoker  . Smokeless tobacco: Never Used  Substance Use Topics  . Alcohol use: No    Alcohol/week: 0.0 standard drinks  . Drug use: No     Allergies   Codeine   Review of Systems Review of Systems  Constitutional: Negative for diaphoresis.  HENT: Positive for facial swelling.   Respiratory: Negative for shortness of breath.   Cardiovascular: Negative for chest pain.  Gastrointestinal: Negative for abdominal pain and nausea.  Musculoskeletal: Negative for back pain and neck pain.  Skin: Positive for wound.  Neurological: Negative for syncope, light-headedness and headaches.     Physical Exam  Updated Vital Signs BP (!) 173/101 (BP Location: Left Arm)   Pulse 77   Temp 98.7 F (37.1 C) (Oral)   Resp 18   Ht 5\' 4"  (1.626 m)   Wt 59.4 kg   SpO2 100%   BMI 22.49 kg/m   Physical Exam Vitals signs and nursing note reviewed.  Constitutional:      Appearance: She is well-developed.  HENT:     Head: Normocephalic.     Comments: Hematoma to left temporal area with discoloration forming below left eye. No significant abrasion or laceration. No facial bony tenderness. No hemotympanum. No epistaxis or dental injury.  Eyes:     Conjunctiva/sclera: Conjunctivae normal.     Comments: Full pain-free ROM.  Neck:     Musculoskeletal: Normal range of motion.  Pulmonary:     Effort: Pulmonary effort is normal.  Chest:     Chest wall: No tenderness.  Abdominal:     Tenderness: There is no abdominal tenderness.  Musculoskeletal: Normal range of motion.     Comments: No bony deformities of extremities. No midline or paracervical tenderness.   Skin:    General: Skin is warm and dry.     Comments: 5.5 cm laceration anterior left knee, minimally contaminated. No significantly  Neurological:     General: No focal deficit present.     Mental Status: She is alert and oriented to person, place, and time. Mental status is at baseline.     Sensory: No sensory deficit.     Motor: No weakness.     Coordination: Coordination normal.     Gait: Gait normal.      ED Treatments / Results  Labs (all labs ordered are listed, but only abnormal results are displayed) Labs Reviewed - No data to display  EKG None  Radiology No results found.  Procedures .Marland KitchenLaceration Repair  Date/Time: 04/24/2019 5:30 AM Performed by: Charlann Lange, PA-C Authorized by: Charlann Lange, PA-C   Consent:    Consent obtained:  Verbal   Consent given by:  Patient Anesthesia (see MAR for exact dosages):    Anesthesia method:  Local infiltration   Local anesthetic:  Lidocaine 2% WITH epi Laceration  details:    Location:  Leg   Leg location:  L knee   Length (cm):  5.5 Repair type:    Repair type:  Simple Pre-procedure details:    Preparation:  Patient was prepped and draped in usual sterile fashion Exploration:    Wound exploration: entire depth of wound probed and visualized     Wound extent: foreign bodies/material  Wound extent: no muscle damage noted, no tendon damage noted and no underlying fracture noted     Foreign bodies/material:  Dirt   Contaminated: yes   Treatment:    Area cleansed with:  Saline   Amount of cleaning:  Standard   Irrigation solution:  Sterile saline Skin repair:    Repair method:  Sutures   Suture size:  3-0   Suture material:  Nylon   Suture technique:  Simple interrupted   Number of sutures:  9 Approximation:    Approximation:  Close Post-procedure details:    Dressing:  Non-adherent dressing and antibiotic ointment   Patient tolerance of procedure:  Tolerated well, no immediate complications   (including critical care time)  Medications Ordered in ED Medications  lidocaine-EPINEPHrine (XYLOCAINE W/EPI) 2 %-1:200000 (PF) injection 10 mL (10 mLs Infiltration Given 04/24/19 0324)     Initial Impression / Assessment and Plan / ED Course  I have reviewed the triage vital signs and the nursing notes.  Pertinent labs & imaging results that were available during my care of the patient were reviewed by me and considered in my medical decision making (see chart for details).        Patient to ED after fall while running.   No neurologic deficits over extended observation time in ED. Doubt head injury. No CT scan performed.   Laceration over left knee repaired as per above note. She has been ambulatory. Doubt bony injury. Tetanus is up to date.    Final Clinical Impressions(s) / ED Diagnoses   Final diagnoses:  None   1. Fall 2. Facial contusion 3. Laceration left knee  ED Discharge Orders    None       Elpidio Anis,  PA-C 04/24/19 0534    Gilda Crease, MD 04/24/19 [phone]

## 2019-05-01 ENCOUNTER — Encounter: Payer: Self-pay | Admitting: Gynecology

## 2019-05-04 ENCOUNTER — Other Ambulatory Visit: Payer: Self-pay

## 2019-05-07 ENCOUNTER — Ambulatory Visit (INDEPENDENT_AMBULATORY_CARE_PROVIDER_SITE_OTHER): Payer: Commercial Managed Care - PPO | Admitting: Gynecology

## 2019-05-07 ENCOUNTER — Encounter: Payer: Self-pay | Admitting: Gynecology

## 2019-05-07 ENCOUNTER — Other Ambulatory Visit: Payer: Self-pay

## 2019-05-07 VITALS — BP 116/76 | Ht 63.5 in | Wt 132.0 lb

## 2019-05-07 DIAGNOSIS — Z01419 Encounter for gynecological examination (general) (routine) without abnormal findings: Secondary | ICD-10-CM

## 2019-05-07 DIAGNOSIS — Z30431 Encounter for routine checking of intrauterine contraceptive device: Secondary | ICD-10-CM

## 2019-05-07 DIAGNOSIS — Z23 Encounter for immunization: Secondary | ICD-10-CM | POA: Diagnosis not present

## 2019-05-07 DIAGNOSIS — A609 Anogenital herpesviral infection, unspecified: Secondary | ICD-10-CM

## 2019-05-07 DIAGNOSIS — Z79890 Hormone replacement therapy (postmenopausal): Secondary | ICD-10-CM

## 2019-05-07 MED ORDER — VALACYCLOVIR HCL 500 MG PO TABS
ORAL_TABLET | ORAL | 4 refills | Status: DC
Start: 2019-05-07 — End: 2020-05-07

## 2019-05-07 MED ORDER — ESTRADIOL 0.075 MG/24HR TD PTTW: 1.0000 | MEDICATED_PATCH | TRANSDERMAL | 4 refills | Status: DC

## 2019-05-07 NOTE — Patient Instructions (Signed)
Follow-up to have the IUD removed in November or December.

## 2019-05-07 NOTE — Progress Notes (Signed)
    Anne Mann 10-28-1960 [id number]        58 y.o.  [id number] for annual gynecologic exam.  Without gynecologic complaints  Past medical history,surgical history, problem list, medications, allergies, family history and social history were all reviewed and documented as reviewed in the EPIC chart.  ROS:  Performed with pertinent positives and negatives included in the history, assessment and plan.   Additional significant findings : None   Exam: Anne Mann assistant Vitals:   05/07/19 1550  BP: 116/76  Weight: 132 lb (59.9 kg)  Height: 5' 3.5" (1.613 m)   Body mass index is 23.02 kg/m.  General appearance:  Normal affect, orientation and appearance. Skin: Grossly normal HEENT: Without gross lesions.  No cervical or supraclavicular adenopathy. Thyroid normal.  Lungs:  Clear without wheezing, rales or rhonchi Cardiac: RR, without RMG Abdominal:  Soft, nontender, without masses, guarding, rebound, organomegaly or hernia Breasts:  Examined lying and sitting without masses, retractions, discharge or axillary adenopathy. Pelvic:  Ext, BUS, Vagina: Normal with atrophic changes  Cervix: Normal with atrophic changes.  IUD string visualized  Uterus: Anteverted, normal size, shape and contour, midline and mobile nontender   Adnexa: Without masses or tenderness    Anus and perineum: Normal   Rectovaginal: Normal sphincter tone without palpated masses or tenderness.    Assessment/Plan:  58 y.o. [id number] female for annual gynecologic exam.   1. Postmenopausal/HRT.  Continues on Vivelle 0.075 mg patch using her IUD for endometrial protection.  IUD placed 06/2014.  Due to be removed in November.  She would like to continue on HRT for now.  We again discussed the risks versus benefits to include the issues of thrombosis and breast cancer.  Refill for her Vivelle 0.075 mg patch x1 year.  She will return in November/December to have her IUD removed and we will start her on Prometrium 100 mg  nightly at that time.  She has no bleeding history. 2. HSV with occasional outbreaks.  Uses Valtrex 500 mg daily.  Refill x1 year provided. 3. Mammography 03/2019.  Continue with annual mammography next year when due. 4. Colonoscopy 2012.  Repeat at their recommended interval. 5. Pap smear/HPV 05/2017.  No Pap smear done today.  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  Plan repeat Pap smear/HPV at 5-year interval per current screening guidelines. 6. Health maintenance.  No routine lab work done as patient does this elsewhere.  Follow-up to have IUD removed in November/December and will start on Prometrium then.  Follow-up in 1 year for annual exam.   Anne Auerbach MD, 4:12 PM 05/07/2019

## 2019-06-07 ENCOUNTER — Other Ambulatory Visit: Payer: Self-pay

## 2019-06-08 ENCOUNTER — Encounter: Payer: Self-pay | Admitting: Gynecology

## 2019-06-08 ENCOUNTER — Ambulatory Visit: Payer: Commercial Managed Care - PPO | Admitting: Gynecology

## 2019-06-08 VITALS — BP 116/74

## 2019-06-08 DIAGNOSIS — Z79890 Hormone replacement therapy (postmenopausal): Secondary | ICD-10-CM | POA: Diagnosis not present

## 2019-06-08 DIAGNOSIS — Z30432 Encounter for removal of intrauterine contraceptive device: Secondary | ICD-10-CM | POA: Diagnosis not present

## 2019-06-08 MED ORDER — PROGESTERONE MICRONIZED 100 MG PO CAPS: 100.0000 mg | ORAL_CAPSULE | Freq: Every day | ORAL | 4 refills | Status: DC

## 2019-06-08 NOTE — Patient Instructions (Signed)
Start on the progesterone pills at bedtime along with your estrogen replacement.  Call if you have any issues with this.

## 2019-06-08 NOTE — Progress Notes (Signed)
    Anne Mann 1961/04/17 [id number]        58 y.o.  G3P2012 returns to have her Mirena IUD removed at her 5-year mark.  She is on estrogen replacement using the Mirena for endometrial protection.  She is planning to start on progesterone.  Past medical history,surgical history, problem list, medications, allergies, family history and social history were all reviewed and documented in the EPIC chart.  Directed ROS with pertinent positives and negatives documented in the history of present illness/assessment and plan.  Exam: [name] assistant Vitals:   06/08/19 0832  BP: 116/74   General appearance:  Normal External BUS vagina normal.  Cervix normal.  IUD string visualized.  The IUD string was grasped with a [place] forcep and her Mirena IUD was removed without difficulty, shown to the patient and discarded.  Assessment/Plan:  58 y.o. [id number] with removal of her Mirena IUD.  She will start on Prometrium 100 mg at bedtime along with her Vivelle 0.075 mg patch.  She will call if she has any issues or bleeding.  Otherwise we will follow-up in 1 year when she is due for her annual exam.    [name] MD, 9:13 AM 06/08/2019

## 2019-10-08 ENCOUNTER — Ambulatory Visit: Payer: Self-pay | Attending: Internal Medicine

## 2019-10-08 DIAGNOSIS — Z23 Encounter for immunization: Secondary | ICD-10-CM | POA: Insufficient documentation

## 2019-10-08 NOTE — Progress Notes (Signed)
   Covid-19 Vaccination Clinic  Name:  Anne Mann    MRN: [id number] DOB: 1960/09/20  10/08/2019  Ms. Anne Mann was observed post Covid-19 immunization for 30 minutes based on pre-vaccination screening without incident. She was provided with Vaccine Information Sheet and instruction to access the V-Safe system.   Ms. Anne Mann was instructed to call 911 with any severe reactions post vaccine: Marland Kitchen Difficulty breathing  . Swelling of face and throat  . A fast heartbeat  . A bad rash all over body  . Dizziness and weakness   Immunizations Administered    Name Date Dose VIS Date Route   Pfizer COVID-19 Vaccine 10/08/2019  1:18 PM 0.3 mL 07/13/2019 Intramuscular   Manufacturer: [employer], [employer]   Lot: [id number]   NDC: [address]-9

## 2019-11-07 ENCOUNTER — Ambulatory Visit: Payer: Self-pay | Attending: Internal Medicine

## 2019-11-07 DIAGNOSIS — Z23 Encounter for immunization: Secondary | ICD-10-CM

## 2019-11-07 NOTE — Progress Notes (Signed)
   Covid-19 Vaccination Clinic  Name:  NAOMY ESHAM    MRN: [id number] DOB: Dec 11, 1960  11/07/2019  Ms. Randa Lynn was observed post Covid-19 immunization for 15 minutes without incident. She was provided with Vaccine Information Sheet and instruction to access the V-Safe system.   Ms. Randa Lynn was instructed to call 911 with any severe reactions post vaccine: Marland Kitchen Difficulty breathing  . Swelling of face and throat  . A fast heartbeat  . A bad rash all over body  . Dizziness and weakness   Immunizations Administered    Name Date Dose VIS Date Route   Pfizer COVID-19 Vaccine 11/07/2019 12:16 PM 0.3 mL 07/13/2019 Intramuscular   Manufacturer: [employer], [employer]   Lot: [id number]   NDC: [address]-2

## 2019-11-19 ENCOUNTER — Other Ambulatory Visit: Payer: Self-pay

## 2019-11-19 MED ORDER — PROGESTERONE MICRONIZED 100 MG PO CAPS: 100.0000 mg | ORAL_CAPSULE | Freq: Every day | ORAL | 1 refills | Status: DC

## 2020-05-07 ENCOUNTER — Ambulatory Visit (INDEPENDENT_AMBULATORY_CARE_PROVIDER_SITE_OTHER): Payer: 59 | Admitting: Obstetrics and Gynecology

## 2020-05-07 ENCOUNTER — Encounter: Payer: Self-pay | Admitting: Obstetrics and Gynecology

## 2020-05-07 ENCOUNTER — Other Ambulatory Visit: Payer: Self-pay

## 2020-05-07 VITALS — BP 118/76 | Ht 64.0 in | Wt 130.0 lb

## 2020-05-07 DIAGNOSIS — A609 Anogenital herpesviral infection, unspecified: Secondary | ICD-10-CM | POA: Diagnosis not present

## 2020-05-07 DIAGNOSIS — Z79890 Hormone replacement therapy (postmenopausal): Secondary | ICD-10-CM | POA: Diagnosis not present

## 2020-05-07 DIAGNOSIS — N950 Postmenopausal bleeding: Secondary | ICD-10-CM

## 2020-05-07 DIAGNOSIS — Z23 Encounter for immunization: Secondary | ICD-10-CM

## 2020-05-07 DIAGNOSIS — Z01419 Encounter for gynecological examination (general) (routine) without abnormal findings: Secondary | ICD-10-CM | POA: Diagnosis not present

## 2020-05-07 MED ORDER — ESTRADIOL 0.05 MG/24HR TD PTTW: 1.0000 | MEDICATED_PATCH | TRANSDERMAL | 12 refills | Status: DC

## 2020-05-07 MED ORDER — VALACYCLOVIR HCL 500 MG PO TABS
ORAL_TABLET | ORAL | 4 refills | Status: DC
Start: 2020-05-07 — End: 2021-07-03

## 2020-05-07 MED ORDER — PROGESTERONE MICRONIZED 100 MG PO CAPS
100.0000 mg | ORAL_CAPSULE | Freq: Every day | ORAL | 4 refills | Status: DC
Start: 2020-05-07 — End: 2021-05-20

## 2020-05-07 NOTE — Progress Notes (Signed)
Anne Mann [birth year] [id number]  SUBJECTIVE:  59 y.o. [id number] female here for a gynecologic exam. She is on HRT, she was on the Mirena IUD through last year for endometrial protection and then switched to oral Prometrium 100 mg nightly which she has been taking.  In the months following the IUD removal she did start develop some abnormal bleeding, and then more recently in the past few months she does get a little bit of a light flow that lasts for a few days tapering off to spotting, this occurs about every 2 weeks.    Current Outpatient Medications  Medication Sig Dispense Refill  . acetaminophen (TYLENOL) 500 MG tablet Take 500-1,000 mg by mouth every 6 (six) hours as needed for headache.    . estradiol (VIVELLE-DOT) 0.075 MG/24HR Place 1 patch onto the skin 2 (two) times a week. 24 patch 4  . loratadine (CLARITIN) 10 MG tablet Take 10 mg by mouth daily.    . montelukast (SINGULAIR) 10 MG tablet Take 10 mg by mouth at bedtime.    . progesterone (PROMETRIUM) 100 MG capsule Take 1 capsule (100 mg total) by mouth daily. 90 capsule 1  . PROVENTIL HFA 108 (90 BASE) MCG/ACT inhaler Inhale 2 puffs into the lungs every 4 (four) hours as needed for wheezing.     . SYMBICORT 160-4.5 MCG/ACT inhaler Inhale 2 puffs into the lungs 2 (two) times daily.   10  . valACYclovir (VALTREX) 500 MG tablet Take one pill daily.  Increase to 2 pills daily with outbreak 120 tablet 4   Current Facility-Administered Medications  Medication Dose Route Frequency Provider Last Rate Last Admin  . levonorgestrel (MIRENA) 20 MCG/24HR IUD   Intrauterine Once Fontaine, Nadyne Coombes, MD       Allergies: Codeine  Patient's last menstrual period was 05/03/2020.  Past medical history,surgical history, problem list, medications, allergies, family history and social history were all reviewed and documented as reviewed in the EPIC chart.  GYN ROS: no abnormal bleeding, pelvic pain or discharge, no breast pain or new or  enlarging lumps on self exam.  No dysuria, urinary frequency, pain with urination, cloudy/malodorous urine.   OBJECTIVE:  BP 118/76   Ht 5\' 4"  (1.626 m)   Wt 130 lb (59 kg)   LMP 05/03/2020   BMI 22.31 kg/m  The patient appears well, alert, oriented, in no distress. ENT normal. Lungs are clear, good air entry, no wheezes, rhonchi or rales. S1 and S2 normal, no murmurs, regular rate and rhythm.  Abdomen soft without tenderness, guarding, mass or organomegaly.  Neurological is normal, no focal findings. BREAST EXAM: breasts appear normal, no suspicious masses, no skin or nipple changes or axillary nodes  PELVIC EXAM: VULVA: normal appearing vulva with atrophic change, no masses, tenderness or lesions, VAGINA: normal appearing vagina with atrophic change, normal color and discharge, no lesions, CERVIX: normal appearing atrophic cervix without discharge or lesions, UTERUS: uterus is normal size, shape, consistency and nontender, ADNEXA: normal adnexa in size, nontender and no masses  Chaperone:  07/03/2020 present during the examination  ASSESSMENT:  59 y.o. 60 here for a gynecologic exam  PLAN:   1. Postmenopausal/HRT.  Vivelle 0.075 mg patch twice weekly and Prometrium 100 mg at bedtime.  Mirena IUD was removed last year (was using this for endometrial protection). Will continue with HRT, risks of thrombosis to include heart attack, stroke, DVT, PE and the breast cancer issue.  We discussed my recommendation to consider weaning especially with  the stimulation of bleeding recently.  We will go down to the Vivelle 0.05 mg patch twice weekly and continue the Prometrium 1 mg at bedtime.  If she is having unacceptable side effects then we will need to go back to her regular dose of the patch.  She will let us know.  Refill on the Vivelle patch and Prometrium x1 year. 2.  Postmenopausal bleeding.  Sounds to be cyclic and every 2 weeks.  She is taking the Prometrium as directed.  May be just  tissue adjustment to new hormone levels after the IUD was removed last year but we should check a sonohysterogram to rule out endometrial pathology.  She will plan to schedule this. 3. Pap smear/HPV 2018.  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  Next Pap smear due 2023 following the current guidelines recommending the 5 year interval. 4. Mammogram 03/2020.  Normal breast exam today.  Continue with annual mammograms.   5. Colonoscopy 2012.  She will follow up at the interval recommended by her GI specialist.   6.  HSV with occasional outbreaks.  Uses Valtrex 500 mg daily with good effect.  Refill x1 year provided. 7. Health maintenance.  No labs today as she normally has these completed elsewhere.  Return annually or sooner, prn.  Theresia Majors MD 05/07/20

## 2020-05-21 ENCOUNTER — Telehealth: Payer: Self-pay | Admitting: *Deleted

## 2020-05-21 NOTE — Telephone Encounter (Signed)
Yes she can wait but this just potentially will delay discovering whether there are any abnormal findings or not that may be causing the abnormal bleeding, and getting the ball rolling for any further work-up or treatment needed for that

## 2020-05-21 NOTE — Telephone Encounter (Signed)
Patient scheduled on 05/28/20 for [facility] patient asked if this is something she can wait until Jan 2022 to have? She received a call about her co-pay for imaging and said it would be best if she can have done in Jan, because she will meet her deductible. Please advise

## 2020-05-22 NOTE — Telephone Encounter (Signed)
Patient informed with below note, she accepts the risk and will call back in Jan 2022 to schedule SHGM. Scheduled SHGM now canceled.

## 2020-05-28 ENCOUNTER — Other Ambulatory Visit: Payer: 59

## 2020-05-28 ENCOUNTER — Ambulatory Visit: Payer: 59 | Admitting: Obstetrics and Gynecology

## 2020-07-11 ENCOUNTER — Other Ambulatory Visit: Payer: Self-pay

## 2020-07-11 MED ORDER — ESTRADIOL 0.05 MG/24HR TD PTTW: 1.0000 | MEDICATED_PATCH | TRANSDERMAL | 3 refills | Status: DC

## 2021-01-16 ENCOUNTER — Ambulatory Visit (INDEPENDENT_AMBULATORY_CARE_PROVIDER_SITE_OTHER): Payer: 59 | Admitting: Family Medicine

## 2021-01-16 ENCOUNTER — Other Ambulatory Visit: Payer: Self-pay

## 2021-01-16 VITALS — BP 126/80 | Ht 64.0 in | Wt 127.0 lb

## 2021-01-16 DIAGNOSIS — M79672 Pain in left foot: Secondary | ICD-10-CM | POA: Diagnosis not present

## 2021-01-16 NOTE — Patient Instructions (Signed)
Thank you for visiting [place] today!   Take Calcium and Vitamin D (5000 units) to help encourage healing  Stick with the stationary bike until this is no longer painful, then you can advance to walk/running per the stress injury progression handout.  Come back to see [place] for new orthotics  You should steadily improve. If you feel like you're not improving, and certainly if your pain worsens, come back to see [place] for reevaluation!

## 2021-01-16 NOTE — Progress Notes (Signed)
  Anne Mann - 60 y.o. female MRN [id number]  Date of birth: [birth year]  SUBJECTIVE:    CC: Left foot pain  Anne Mann presents with left foot pain since mid April. She reports noticing a pain in the lateral aspect of her foot that radiates to her lateral heel. She denies any acute trauma. She has had no swelling or bruising. It hurts her pretty constantly but does feel minimal at rest. It is worse with running and walking. She has stopped running about 3 weeks ago due to the pain. She takes ibuprofen which helps the pain some. Of note, she has a history of a cuboid stress fracture several years ago. She was last seen in 2019 for similar pain and given orthotics due to slight supination during her gait. The orthotics kept her out of pain since that point. She is taking vitamin D and gets her calcium in her diet. Has not had a DEXA scan before. Since stopping running, she feels like her pain has somewhat improved- she has been trying to do stationary bike for exercise, which she says is not painful. Her foot will ache after a long day of standing.   Objective:  VS: BP:126/80  HR: bpm  TEMP: ( )  RESP:   HT:5\' 4"  (162.6 cm)   WT:127 lb (57.6 kg)  BMI:21.79 PHYSICAL EXAM:  Well appearing woman in NAD.  Gait: Slight supination without limp.  Left Foot: No effusion or erythema at the ankle joint. No soft tissue swelling.  TTP at the proximal 4th and 5th metatarsal.  FROM in plantar and dorsiflexion without pain.  5/5 strength in plantarflexion, dorsiflexion, inversion, and eversion without pain. Negative anterior drawer.  Limited Ultrasound Left Foot: 5th metatarsal visualized at its proximal portion without acute bony abnormalities and without doppler flow.  4th metatarsal visualized at its proximal aspect with increased doppler flow and without cortical disruption.  Cuboid bone visualized without acute bony abnormalities.  Impression: Stress reaction at the proximal 4th metatarsal  without cortical disruption.    ASSESSMENT & PLAN:  Left Foot Pain: I suspect her pain is due to a stress reaction of her 4th metatarsal. There were no signs of cortical disruption so this has most likely not progressed to a stress fracture at this time. Orthotics helped her in the past with similar pain so we will have her make an appointment next week. Her current orthotics are worn at the lateral aspect and she does supinate on gait so we will build up padding on the lateral aspect of the orthotic to assist with this. We have also provided her with a stress fracture return to run protocol. She has been instructed to wait and start the protocol after the orthotics are made. The protocol she was given was a 12 week program for tibial stress fractures but she will most likely be able to accelerate the program as this metatarsal stress reaction will most likely heal quicker. We will see her back in 6 weeks to see how she is doing. If she is not improving we can discuss further imaging to evaluate.   Aurelio Jew, MS4   Saw and evaluated patient with medical student. Note has been edited where needed for accuracy. Reather Laurence, DO  Sports Medicine Fellow  I was the preceptor for this visit and available for immediate consultation to both the sports medicine fellow and medical student. Marsa Aris, DO

## 2021-01-22 ENCOUNTER — Ambulatory Visit (INDEPENDENT_AMBULATORY_CARE_PROVIDER_SITE_OTHER): Payer: 59 | Admitting: Family Medicine

## 2021-01-22 ENCOUNTER — Other Ambulatory Visit: Payer: Self-pay

## 2021-01-22 VITALS — BP 114/74 | Ht 64.0 in | Wt 127.0 lb

## 2021-01-22 DIAGNOSIS — Q667 Congenital pes cavus, unspecified foot: Secondary | ICD-10-CM

## 2021-01-22 DIAGNOSIS — M79672 Pain in left foot: Secondary | ICD-10-CM

## 2021-01-22 NOTE — Progress Notes (Signed)
   Office Visit Note   Patient: Anne Mann           Date of Birth: [birth year]           MRN: [id number] Visit Date: 01/22/2021 Requested by: Merri Brunette, MD [phone] [address] [place],  [place] [address] PCP: Merri Brunette, MD  Subjective: CC: Custom orthotics  HPI: 60 year old female with bilateral pes cavus and recent onset of metatarsal pain who is presenting to clinic today for custom orthotic fitting.  Patient is an avid runner with a history of stress injuries, and previously her symptoms were very well controlled with custom orthotics.  Current orthotics are approximately 58 years old, and she confesses that they are starting to feel "worn down."  She is hoping a new pair will allow her to return to running.               Objective: Vital Signs: BP 114/74   Ht 5\' 4"  (1.626 m)   Wt 127 lb (57.6 kg)   LMP 05/03/2020   BMI 21.80 kg/m  No flowsheet data found.   No flowsheet data found.  Physical Exam:  General:  Alert and oriented, in no acute distress. Pulm:  Breathing unlabored. Psy:  Normal mood, congruent affect. Skin: Bilateral feet without bruises, rashes, or erythema. Overlying skin intact.   Foot exam:  Gait is nonantalgic, though she is noticed to walk with slight supination bilaterally.  This is likely exacerbated due to a mild genu valgum deformity bilaterally. Bilateral pes cavus with mild bunion deformities, with early inward curl of fourth and fifth toes bilaterally, most appreciated on the left foot.   Imaging: None today.  Assessment & Plan: 60 year old female presenting to clinic for custom orthotic fitting.  Has mild genu valgum deformity accompanied by pes cavus likely adding to increasing stress on the lateral aspect of her foot-and underlying her recurrent stress injuries in this area.  Symptoms previously well controlled with custom orthotics. -Refit for custom orthotics today, which she stated were comfortable upon fitting.  Displayed  a neutral gait. -Given slight propensity for supination, could consider adding fifth ray post or lateral heel wedge should her symptoms of left foot pain not abate with the increased padding afforded by new orthotics. -Should she notice any discomfort in her orthotics, she is encouraged to return to clinic for adjustment. -Patient had no further questions or concerns today.     Procedures: Patient was fitted for a : standard, cushioned, semi-rigid orthotic. The orthotic was heated and afterward the patient stood on the orthotic blank positioned on the orthotic stand. The patient was positioned in subtalar neutral position and 10 degrees of ankle dorsiflexion in a weight bearing stance. After completion of molding, a stable base was applied to the orthotic blank. The blank was ground to a stable position for weight bearing. Size: 6 mens Base: EVA Posting: None Additional orthotic padding: None     Addendum:  I was the preceptor for this visit and available for immediate consultation.  41 MD Norton Blizzard

## 2021-05-12 ENCOUNTER — Ambulatory Visit: Payer: 59 | Admitting: Obstetrics and Gynecology

## 2021-05-12 ENCOUNTER — Ambulatory Visit: Payer: 59 | Admitting: Sports Medicine

## 2021-05-19 ENCOUNTER — Ambulatory Visit (INDEPENDENT_AMBULATORY_CARE_PROVIDER_SITE_OTHER): Payer: 59 | Admitting: Sports Medicine

## 2021-05-19 ENCOUNTER — Ambulatory Visit
Admission: RE | Admit: 2021-05-19 | Discharge: 2021-05-19 | Disposition: A | Discharge status: Home / Self Care | Payer: 59 | Source: Ambulatory Visit | Attending: Sports Medicine | Admitting: Sports Medicine

## 2021-05-19 VITALS — BP 140/82 | Ht 64.0 in | Wt 128.0 lb

## 2021-05-19 DIAGNOSIS — M79672 Pain in left foot: Secondary | ICD-10-CM

## 2021-05-20 ENCOUNTER — Other Ambulatory Visit (HOSPITAL_COMMUNITY)
Admission: RE | Admit: 2021-05-20 | Discharge: 2021-05-20 | Disposition: A | Discharge status: Home / Self Care | Payer: 59 | Source: Ambulatory Visit | Attending: Obstetrics and Gynecology | Admitting: Obstetrics and Gynecology

## 2021-05-20 ENCOUNTER — Ambulatory Visit (INDEPENDENT_AMBULATORY_CARE_PROVIDER_SITE_OTHER): Payer: 59 | Admitting: Obstetrics & Gynecology

## 2021-05-20 ENCOUNTER — Other Ambulatory Visit: Payer: Self-pay

## 2021-05-20 ENCOUNTER — Encounter: Payer: Self-pay | Admitting: Obstetrics & Gynecology

## 2021-05-20 VITALS — BP 110/64 | HR 89 | Resp 16 | Ht 63.75 in | Wt 132.0 lb

## 2021-05-20 DIAGNOSIS — Z01419 Encounter for gynecological examination (general) (routine) without abnormal findings: Secondary | ICD-10-CM | POA: Insufficient documentation

## 2021-05-20 DIAGNOSIS — Z9889 Other specified postprocedural states: Secondary | ICD-10-CM

## 2021-05-20 DIAGNOSIS — Z79890 Hormone replacement therapy (postmenopausal): Secondary | ICD-10-CM

## 2021-05-20 MED ORDER — ESTRADIOL 0.0375 MG/24HR TD PTTW: 1.0000 | MEDICATED_PATCH | TRANSDERMAL | 4 refills | Status: DC

## 2021-05-20 MED ORDER — PROGESTERONE MICRONIZED 100 MG PO CAPS: 100.0000 mg | ORAL_CAPSULE | Freq: Every day | ORAL | 4 refills | Status: DC

## 2021-05-20 NOTE — Progress Notes (Signed)
Anne Mann 22-Sep-1960 [id number]   History:    60 y.o. G3P2A1L2  RP:  Established patient presenting for annual gyn exam   HPI: Postmenopausal on HRT with Vivelle 0.05 mg patch twice weekly and Prometrium 100 mg at bedtime.  No PMB.  No pelvic pain. Pap smear/HPV Neg 2018.  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  Breasts normal. Mammo 03/2021.  Colonoscopy 2022.  HSV with occasional outbreaks.  Uses Valtrex 500 mg daily with good effect.  BMI 22.84.  Good fitness, healthy nutrition.  Past medical history,surgical history, family history and social history were all reviewed and documented in the EPIC chart.  Gynecologic History Patient's last menstrual period was 05/03/2020.  Obstetric History OB History  Gravida Para Term Preterm AB Living  3 2 2   1 2   SAB IAB Ectopic Multiple Live Births  1            # Outcome Date GA Lbr Len/2nd Weight Sex Delivery Anes PTL Lv  3 SAB           2 Term           1 Term              ROS: A ROS was performed and pertinent positives and negatives are included in the history.  GENERAL: No fevers or chills. HEENT: No change in vision, no earache, sore throat or sinus congestion. NECK: No pain or stiffness. CARDIOVASCULAR: No chest pain or pressure. No palpitations. PULMONARY: No shortness of breath, cough or wheeze. GASTROINTESTINAL: No abdominal pain, nausea, vomiting or diarrhea, melena or bright red blood per rectum. GENITOURINARY: No urinary frequency, urgency, hesitancy or dysuria. MUSCULOSKELETAL: No joint or muscle pain, no back pain, no recent trauma. DERMATOLOGIC: No rash, no itching, no lesions. ENDOCRINE: No polyuria, polydipsia, no heat or cold intolerance. No recent change in weight. HEMATOLOGICAL: No anemia or easy bruising or bleeding. NEUROLOGIC: No headache, seizures, numbness, tingling or weakness. PSYCHIATRIC: No depression, no loss of interest in normal activity or change in sleep pattern.     Exam:   BP  110/64   Pulse 89   Resp 16   Ht 5' 3.75" (1.619 m)   Wt 132 lb (59.9 kg)   LMP 05/03/2020   BMI 22.84 kg/m   Body mass index is 22.84 kg/m.  General appearance : Well developed well nourished female. No acute distress HEENT: Eyes: no retinal hemorrhage or exudates,  Neck supple, trachea midline, no carotid bruits, no thyroidmegaly Lungs: Clear to auscultation, no rhonchi or wheezes, or rib retractions  Heart: Regular rate and rhythm, no murmurs or gallops Breast:Examined in sitting and supine position were symmetrical in appearance, no palpable masses or tenderness,  no skin retraction, no nipple inversion, no nipple discharge, no skin discoloration, no axillary or supraclavicular lymphadenopathy Abdomen: no palpable masses or tenderness, no rebound or guarding Extremities: no edema or skin discoloration or tenderness  Pelvic: Vulva: Normal             Vagina: No gross lesions or discharge  Cervix: No gross lesions or discharge.  Pap reflex done.  Uterus  AV, normal size, shape and consistency, non-tender and mobile  Adnexa  Without masses or tenderness  Anus: Normal   Assessment/Plan:  60 y.o. female for annual exam   1. Encounter for routine gynecological examination with Papanicolaou smear of cervix Normal gynecologic exam in menopause.  Pap reflex done.  Breast exam normal.  Screening mammo Neg 03/2021.  [facility] 2022. Good BMI 22.84.  Good fitness and nutrition. Health labs with Fam MD. - Cytology - PAP( [facility])  2. H/O LEEP - Cytology - PAP( [place])  3. Postmenopausal hormone replacement therapy Will decrease Estradiol patch to 0.0375 twice a week and Prometrium 100 mg HS.  No CI to continue.  Risks/benefits reviewed.  Prescription sent to pharmacy.  Other orders - estradiol (VIVELLE-DOT) 0.0375 MG/24HR; Place 1 patch onto the skin 2 (two) times a week. - progesterone (PROMETRIUM) 100 MG capsule; Take 1 capsule (100 mg total) by mouth daily.   Genia Del MD, 3:40 PM 05/20/2021

## 2021-05-20 NOTE — Progress Notes (Addendum)
   Subjective:    Patient ID: Anne Mann, female    DOB: Oct 02, 1960, 60 y.o.   MRN: [id number]  HPI chief complaint: Left foot pain  Patient presents today with persistent lateral left foot pain.  She was last seen in the office in June.  Custom orthotics were made then for her cavus foot but that has not helped her lateral foot pain.  She has not been able to return to running.  Pain is also present with walking but not as bad as with running.  It has been present now for several months.  No known trauma.  An old MRI of this left foot showed moderate degenerative changes at the first MTP joint some mild degenerative changes at the Lisfranc joint but her current location of pain is more along the base of the fifth metatarsal.    Review of Systems As above    Objective:   Physical Exam  Developed, well nourished.  No acute distress  Left foot: Cavus foot.  No soft tissue swelling.  She is tender to palpation at the base of the fifth metatarsal with pain with metatarsal squeezing.  No other bony tenderness to palpation.  Good pulses.  X-rays of the left foot including AP, lateral, and oblique views show no acute findings      Assessment & Plan:   Left foot pain worrisome for stress fracture at the base of the fifth metatarsal  MRI of the left foot specifically to rule out a stress fracture at the base of the fifth metatarsal.  Phone follow-up with those results when available and we will delineate further treatment based on those findings.  This note was dictated using Dragon naturally speaking software and may contain errors in syntax, spelling, or content which have not been identified prior to signing this note.

## 2021-05-21 ENCOUNTER — Encounter: Payer: Self-pay | Admitting: Obstetrics & Gynecology

## 2021-05-21 LAB — CYTOLOGY - PAP: Diagnosis: NEGATIVE

## 2021-05-29 ENCOUNTER — Other Ambulatory Visit: Payer: 59

## 2021-05-30 ENCOUNTER — Other Ambulatory Visit: Payer: Self-pay

## 2021-05-30 ENCOUNTER — Ambulatory Visit
Admission: RE | Admit: 2021-05-30 | Discharge: 2021-05-30 | Disposition: A | Discharge status: Home / Self Care | Payer: 59 | Source: Ambulatory Visit | Attending: Sports Medicine | Admitting: Sports Medicine

## 2021-05-30 DIAGNOSIS — M79672 Pain in left foot: Secondary | ICD-10-CM

## 2021-06-03 ENCOUNTER — Telehealth: Payer: Self-pay | Admitting: Sports Medicine

## 2021-06-03 NOTE — Telephone Encounter (Signed)
  I spoke with Anne Mann on the phone today after reviewing the MRI of her left foot.  There were no findings to explain her lateral foot pain.  She does have mild to moderate osteoarthritis of the second and third tarsometatarsal joints as well as some inflammation around a bipartite medial hallux sesamoid.  However, she does not complain of pain in either of those areas.  I would like to seek the opinion of Dr. Susa Simmonds at [facility].Anne Mann is in agreement with that plan.  In the meantime, I have reassured her that there are no findings to suggest a fifth metatarsal stress fracture and I think she can continue to be active, including walking and running, using pain as her guide.

## 2021-06-03 NOTE — Telephone Encounter (Signed)
Dr. [facility] [address] [place] [phone]  Appt: 06/05/2021 @ 9:30 am

## 2021-07-03 ENCOUNTER — Other Ambulatory Visit: Payer: Self-pay | Admitting: *Deleted

## 2021-07-03 NOTE — Telephone Encounter (Signed)
Med refill request received from [facility].  Valacyclovir 500 mg tab. Take 1 tab PO daily, increase to 2 tabs daily with outbreak.  Last AEX: 05/20/21, ML Next AEX: Not scheduled Last MMG (if hormonal med) N/A  Refill authorized: Please Advise? Rx pended  Routing to Dr. Seymour Bars

## 2021-07-06 MED ORDER — VALACYCLOVIR HCL 500 MG PO TABS: ORAL_TABLET | ORAL | 4 refills | Status: DC

## 2021-07-08 ENCOUNTER — Other Ambulatory Visit: Payer: Self-pay | Admitting: *Deleted

## 2021-07-08 NOTE — Telephone Encounter (Signed)
Opened in error. Refill sent 07/06/21.

## 2021-09-22 DIAGNOSIS — B001 Herpesviral vesicular dermatitis: Secondary | ICD-10-CM | POA: Diagnosis not present

## 2021-09-22 DIAGNOSIS — G43009 Migraine without aura, not intractable, without status migrainosus: Secondary | ICD-10-CM | POA: Diagnosis not present

## 2021-09-22 DIAGNOSIS — J4530 Mild persistent asthma, uncomplicated: Secondary | ICD-10-CM | POA: Diagnosis not present

## 2021-12-12 DIAGNOSIS — R030 Elevated blood-pressure reading, without diagnosis of hypertension: Secondary | ICD-10-CM | POA: Diagnosis not present

## 2021-12-12 DIAGNOSIS — J0140 Acute pansinusitis, unspecified: Secondary | ICD-10-CM | POA: Diagnosis not present

## 2022-01-20 DIAGNOSIS — J0140 Acute pansinusitis, unspecified: Secondary | ICD-10-CM | POA: Diagnosis not present

## 2022-01-28 ENCOUNTER — Encounter: Payer: Self-pay | Admitting: Obstetrics & Gynecology

## 2022-01-28 ENCOUNTER — Ambulatory Visit: Payer: BC Managed Care – PPO | Admitting: Obstetrics & Gynecology

## 2022-01-28 ENCOUNTER — Telehealth: Payer: Self-pay | Admitting: *Deleted

## 2022-01-28 VITALS — BP 110/68

## 2022-01-28 DIAGNOSIS — N644 Mastodynia: Secondary | ICD-10-CM | POA: Diagnosis not present

## 2022-01-28 DIAGNOSIS — N898 Other specified noninflammatory disorders of vagina: Secondary | ICD-10-CM | POA: Diagnosis not present

## 2022-01-28 DIAGNOSIS — N6320 Unspecified lump in the left breast, unspecified quadrant: Secondary | ICD-10-CM | POA: Diagnosis not present

## 2022-01-28 LAB — WET PREP FOR TRICH, YEAST, CLUE

## 2022-01-28 MED ORDER — FLUCONAZOLE 150 MG PO TABS: 150.0000 mg | ORAL_TABLET | ORAL | 1 refills | Status: AC

## 2022-01-28 NOTE — Telephone Encounter (Signed)
Patient scheduled on 02/04/22 @ 10:00am  arrive 15 minutes early.  Patient informed with time and date. Order faxed

## 2022-01-28 NOTE — Telephone Encounter (Signed)
-----   Message from Genia Del, MD sent at 01/28/2022  2:17 PM EDT ----- Regarding: Schedule a Left Dx mammo/US Left breast tenderness at the external side x 1 week.  On breast exam:  Small nodule at 1-2 O'clock and 4 O'Clock close to the nipple.

## 2022-01-28 NOTE — Progress Notes (Signed)
    Anne Mann February 24, 1961 [id number]        61 y.o.  [id number]   RP: Left breast tenderness x 1 week  HPI: Left breast tenderness x 1 week.  Feels that her breast is a little lumpy at upper outer quadrant.  Took ABTx for a sinus infection and now has a mild d/c with itchiness vaginally and at the vulva.   OB History  Gravida Para Term Preterm AB Living  3 2 2   1 2   SAB IAB Ectopic Multiple Live Births  1            # Outcome Date GA Lbr Len/2nd Weight Sex Delivery Anes PTL Lv  3 SAB           2 Term           1 Term             Past medical history,surgical history, problem list, medications, allergies, family history and social history were all reviewed and documented in the EPIC chart.   Directed ROS with pertinent positives and negatives documented in the history of present illness/assessment and plan.  Exam:  Vitals:   01/28/22 1345  BP: 110/68   General appearance:  Normal  Breast exam: Rt breast normal                       Lt breast:  Elongated dense area at 1-2 O'clock and Small nodule at 4 O'clock at the nipple.  Gynecologic exam: Vulva normal.  Speculum:  Cervix/vagina normal.  Increased discharge.  Wet prep done.  Wet prep:  Yeasts present   Assessment/Plan:  61 y.o. 48   1. Painful lumpy left breast Left breast tenderness x 1 week.  Feels that her breast is a little lumpy at upper outer quadrant.  On Left breast exam:  Elongated dense area at 1-2 O'clock and Small nodule at 4 O'clock at the nipple.  Will further investigate with a Left Dx Mammo/US.  Patient voiced agreement with the plan.  2. Itching in the vaginal area Took ABTx for a sinus infection and now has a mild d/c with itchiness vaginally and at the vulva.  Yeast vaginitis confirmed by Wet prep.  Decision to treat with Fluconazole.  Usage reviewed and prescription sent to pharmacy. - WET PREP FOR TRICH, YEAST, CLUE  Other orders - fluconazole (DIFLUCAN) 150 MG tablet; Take 1 tablet  (150 mg total) by mouth every other day for 3 doses.   [id number] MD, 2:19 PM 01/28/2022

## 2022-01-29 ENCOUNTER — Encounter: Payer: Self-pay | Admitting: Obstetrics & Gynecology

## 2022-02-04 DIAGNOSIS — N6489 Other specified disorders of breast: Secondary | ICD-10-CM | POA: Diagnosis not present

## 2022-02-05 ENCOUNTER — Encounter: Payer: Self-pay | Admitting: Obstetrics & Gynecology

## 2022-02-10 ENCOUNTER — Other Ambulatory Visit: Payer: Self-pay

## 2022-02-10 DIAGNOSIS — N6012 Diffuse cystic mastopathy of left breast: Secondary | ICD-10-CM | POA: Diagnosis not present

## 2022-02-10 DIAGNOSIS — D242 Benign neoplasm of left breast: Secondary | ICD-10-CM | POA: Diagnosis not present

## 2022-02-10 DIAGNOSIS — N6325 Unspecified lump in the left breast, overlapping quadrants: Secondary | ICD-10-CM | POA: Diagnosis not present

## 2022-02-18 ENCOUNTER — Encounter: Payer: Self-pay | Admitting: Obstetrics & Gynecology

## 2022-02-28 IMAGING — MR MR FOOT*L* W/O CM
4 of 5 series · 19 of 40 positions shown · non-contrast
Comparison: None.

CLINICAL DATA: Foot pain, fifth metatarsal stress fracture is
suspected.

EXAM:
MRI OF THE LEFT FOOT WITHOUT CONTRAST
TECHNIQUE: Multiplanar, multisequence MR imaging of the left foot was
performed. No intravenous contrast was administered.

[Series 4: T1 · coronal · 3.0mm · 0.19mm/px · 3 of 44 slices shown (1 of 2)]
[im 8/44]
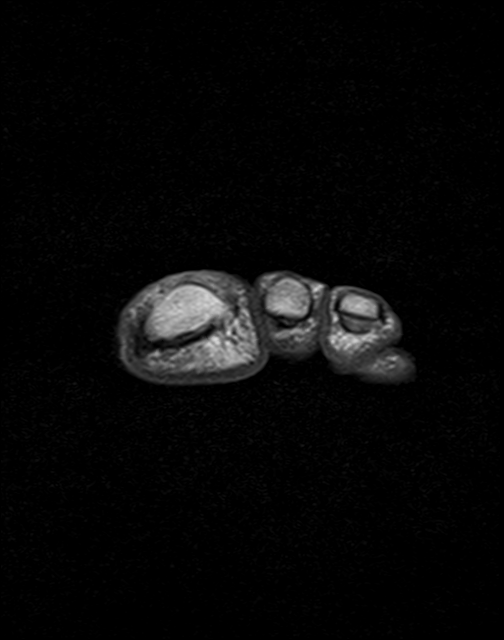
[im 24/44]
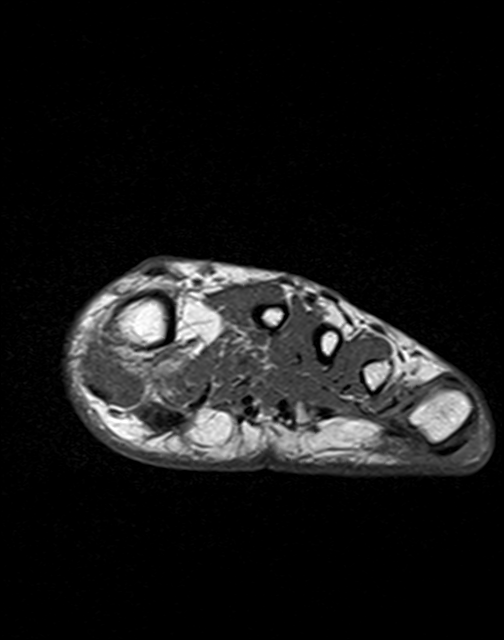
[im 40/44]
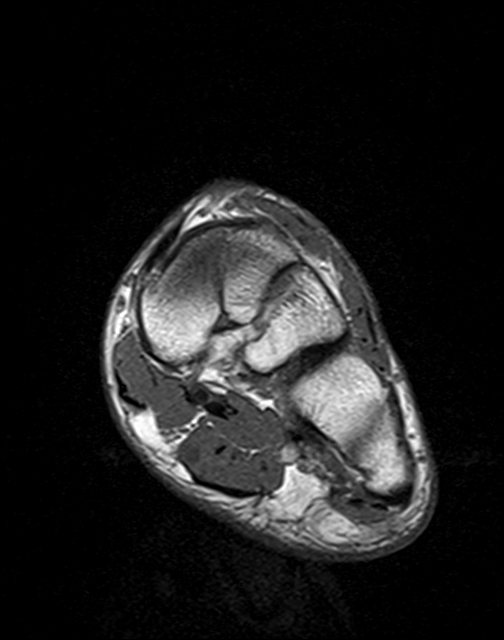

[Series 5: T2 fat-sat · coronal · 3.0mm · 0.19mm/px · 10 of 44 slices shown (1 of 2)]
[im 1/44]
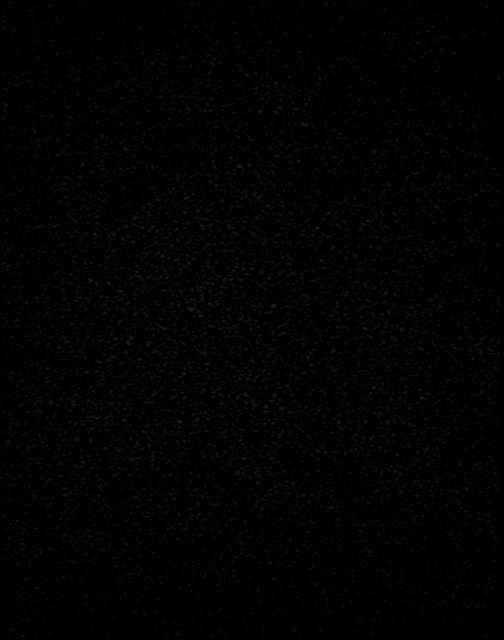
[im 5/44]
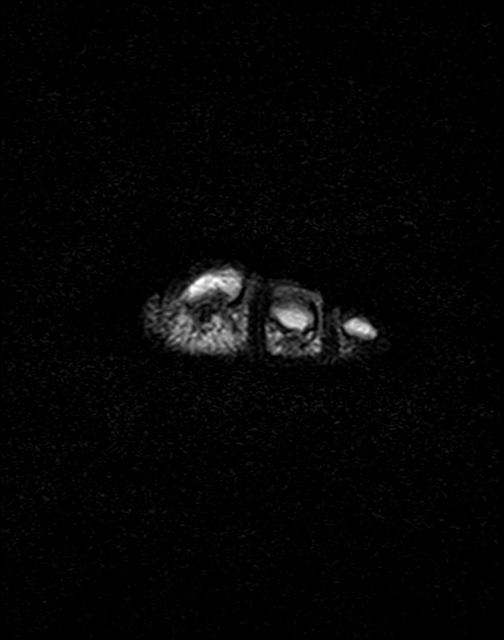
[im 9/44]
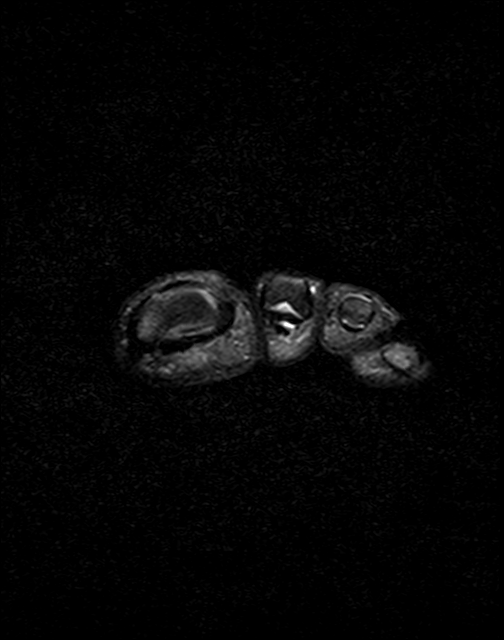
[im 13/44]
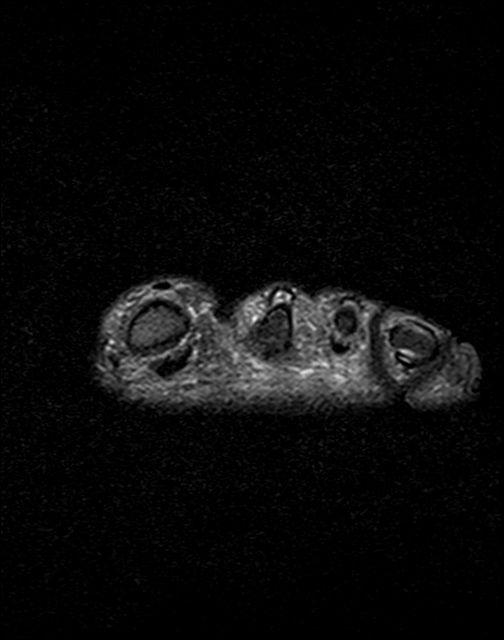
[im 18/44]
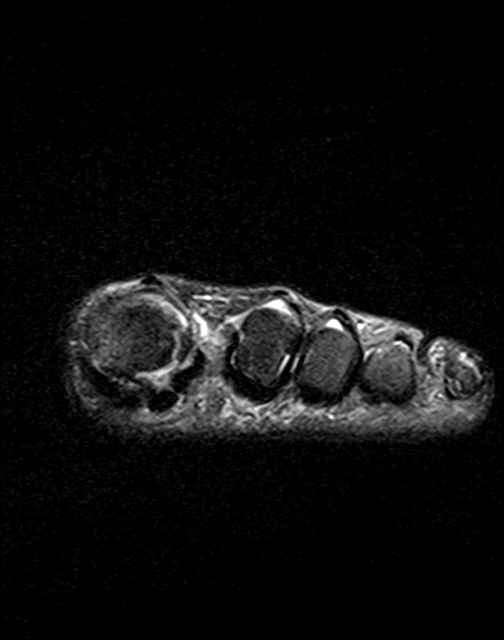
[im 22/44]
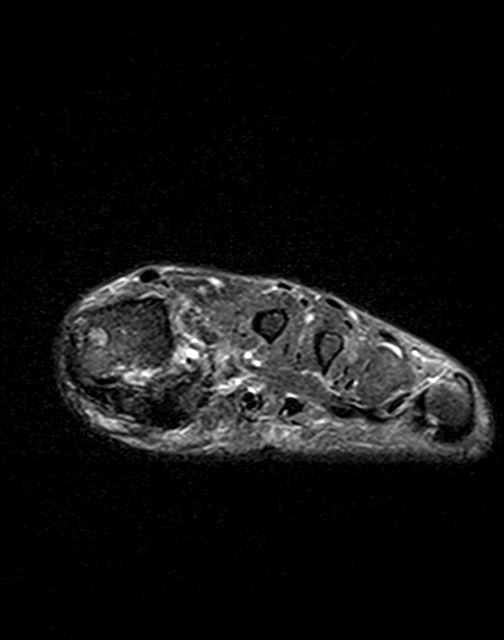
[im 26/44]
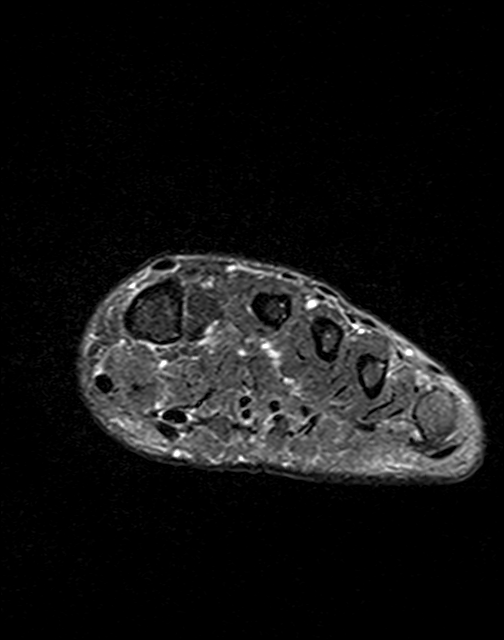
[im 31/44]
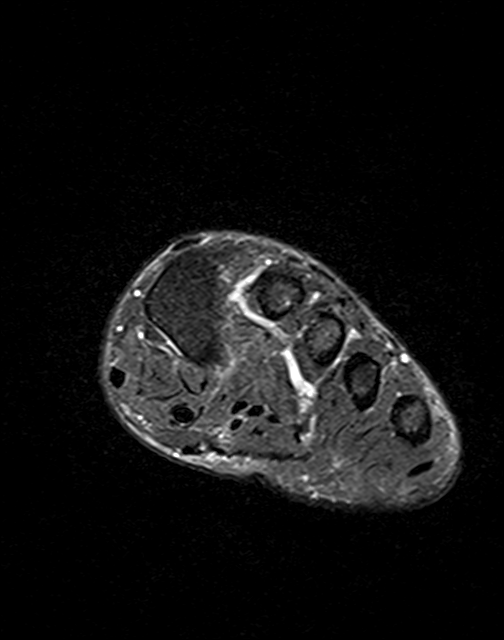
[im 35/44]
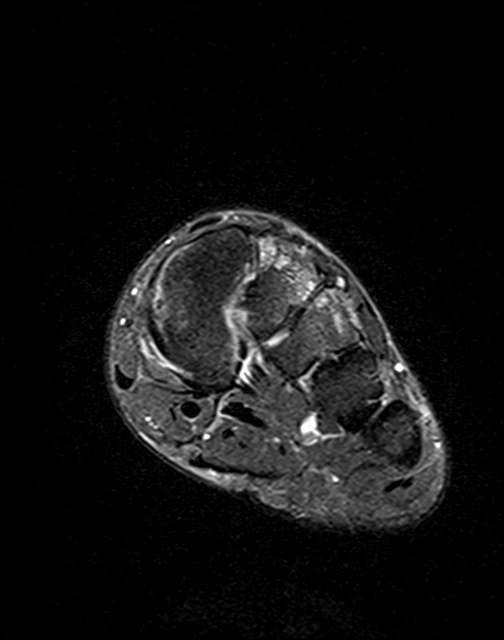
[im 39/44]
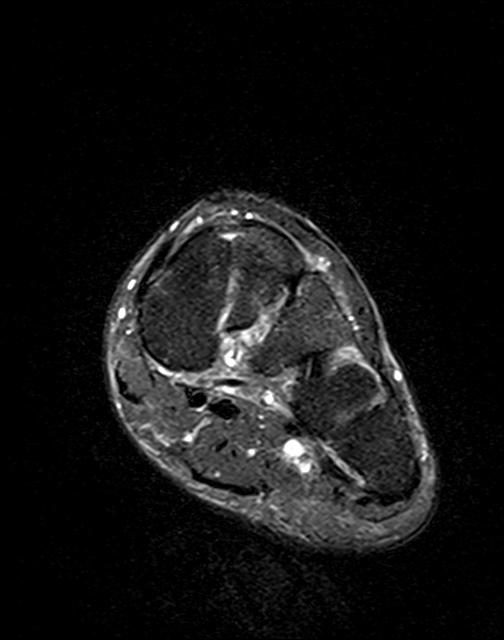

[Series 6: T2 fat-sat · axial · 3.0mm · 0.35mm/px · z∈[-115,-36]mm · 3 of 22 slices shown (2 of 2)]
[im 1/22]
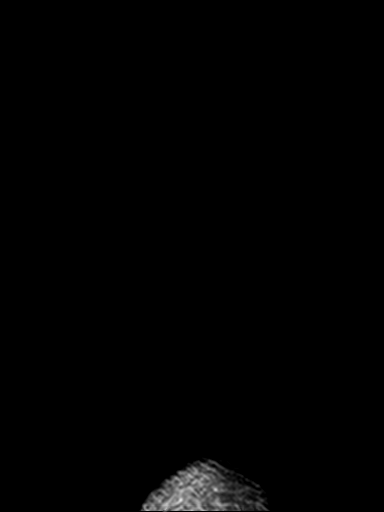
[im 11/22]
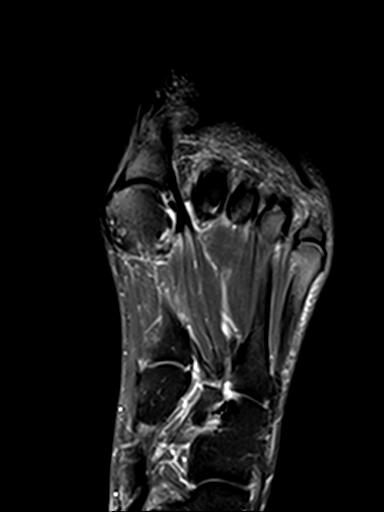
[im 22/22]
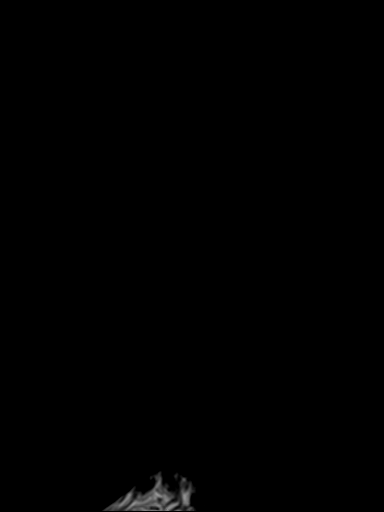

[Series 7: T1 · axial · 3.0mm · 0.35mm/px · z∈[-115,-36]mm · 3 of 22 slices shown (2 of 2)]
[im 1/22]
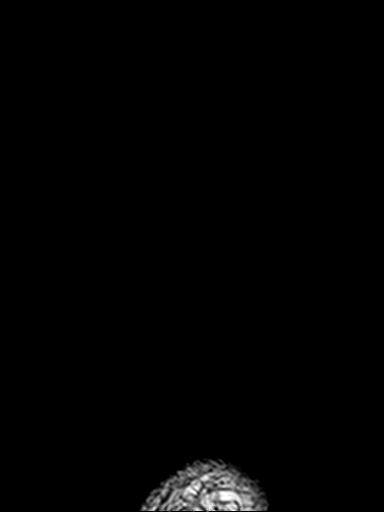
[im 11/22]
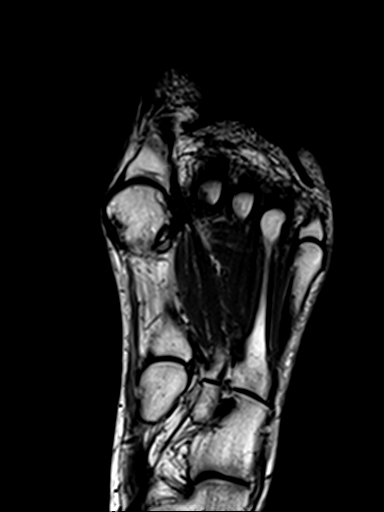
[im 22/22]
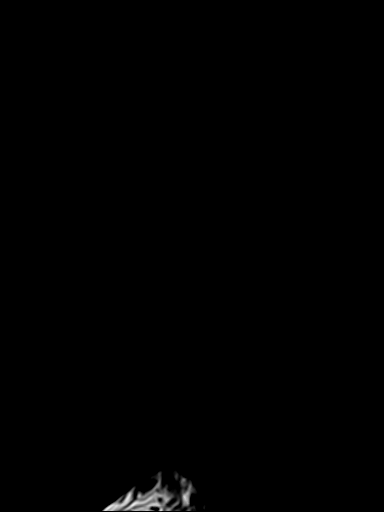

[19 of 40 positions shown; findings below may reference images not displayed]

FINDINGS: Bones/Joint/Cartilage

No fracture or dislocation.  No aggressive osseous lesion.

Mild osteoarthritis of the first MTP joint. Hallux valgus of the
first MTP joint.

Mild-moderate osteoarthritis of the second and third tarsometatarsal
joints with subchondral reactive marrow changes. Mild osteoarthritis
of the first TMT joint.

Bipartite medial hallux sesamoid with marrow edema within the
fragmented medial hallux sesamoid and the adjacent metatarsal head
consistent with sesamoiditis.

Ligaments

Collateral ligaments are intact.  Lisfranc ligament is intact.

Muscles and Tendons

Flexor, peroneal and extensor compartment tendons are intact.
Muscles are normal.

Soft tissue
No fluid collection or hematoma.  No soft tissue mass.
IMPRESSION: 1. No acute osseous injury of the left foot.
2. Mild-moderate osteoarthritis of the second and third
tarsometatarsal joints with subchondral reactive marrow changes.
3. Bipartite medial hallux sesamoid with marrow edema within the
fragmented medial hallux sesamoid and the adjacent metatarsal head
consistent with sesamoiditis.

## 2022-04-08 ENCOUNTER — Ambulatory Visit (INDEPENDENT_AMBULATORY_CARE_PROVIDER_SITE_OTHER): Payer: BC Managed Care – PPO | Admitting: Sports Medicine

## 2022-04-08 VITALS — BP 136/68 | Ht 64.0 in | Wt 130.0 lb

## 2022-04-08 DIAGNOSIS — M7541 Impingement syndrome of right shoulder: Secondary | ICD-10-CM | POA: Diagnosis not present

## 2022-04-08 NOTE — Progress Notes (Signed)
   Subjective:    Patient ID: Anne Mann, female    DOB: 06/18/1961, 61 y.o.   MRN: [id number]  HPI chief complaint: Right shoulder pain  Anne Mann presents today complaining of approximately 1 month of superior and lateral right shoulder pain.  No specific injury that she can recall.  She believes that her workstation may be the reason for her discomfort.  She has not shifted things around and has noticed some improvement.  She describes an achy discomfort and a feeling of having to move her shoulder around to find relief.  She does notice some occasional clicking and catching in the shoulder as well.  She is been trying Tylenol with some improvement.  Ice has been helpful.  She also tried ibuprofen but experienced some GI upset with that.  She denies any problems with her shoulder in the past.  No pain at night.  She has noticed a little bit of weakness with activities such as opening jars.  Pain will radiate into the humerus but not past the elbow.  Interim medical history reviewed Medications reviewed Allergies reviewed   Review of Systems As above    Objective:   Physical Exam  Well-developed, well-nourished.  No acute distress  Right shoulder: Full painless range of motion.  Slight tenderness to palpation of the [facility] joint.  Positive empty can, positive Hawkins.  She has 5/5 strength with resisted supraspinatus but this does reproduce pain.  4+/5 strength with resisted external rotation which also reproduces pain.  No pain with resisted internal rotation which is 5/5.  Negative speeds.  Neurovascularly intact distally.      Assessment & Plan:   Right shoulder pain likely secondary to rotator cuff impingement/rotator cuff tendinopathy  Home exercise program to work on rotator cuff strengthening.  She may continue with Tylenol as needed.  We also discussed trying Aleve as long as it does not cause any stomach upset.  I do believe the ergonomics of her workstation are playing a role in  her current symptoms so she will continue to adjust that.  We will tentatively schedule a follow-up visit for 3 weeks from now.  If symptoms are not improving then we will plan on performing a subacromial cortisone injection at that time.  This note was dictated using Dragon naturally speaking software and may contain errors in syntax, spelling, or content which have not been identified prior to signing this note.

## 2022-04-29 ENCOUNTER — Ambulatory Visit: Payer: BC Managed Care – PPO | Admitting: Sports Medicine

## 2022-05-06 ENCOUNTER — Ambulatory Visit: Payer: BC Managed Care – PPO | Admitting: Sports Medicine

## 2022-05-06 VITALS — BP 141/91 | Ht 64.0 in | Wt 130.0 lb

## 2022-05-06 DIAGNOSIS — M7541 Impingement syndrome of right shoulder: Secondary | ICD-10-CM | POA: Diagnosis not present

## 2022-05-06 NOTE — Progress Notes (Signed)
  Anne Mann - 61 y.o. female MRN [id number]  Date of birth: 1961/04/29    CHIEF COMPLAINT:   R shoulder pain follow up    SUBJECTIVE:   HPI:  Pleasant 61 female comes clinic for follow-up of right shoulder pain.  She was last evaluated here about 3 weeks ago and diagnosed with right rotator cuff impingement.  The thought at that time was that most of her symptoms were due to the ergonomics of her desk/keyboard set up at her job.  For the last 3 weeks she has been doing a home exercise plan 5 times a week, taking Motrin twice a day without GI issues and recently got a new keyboard set up that is below her desk.  Today she reports she is definitely improving.  She feels her strength is much stronger than it was 3 weeks ago.  She still occasionally has some discomfort when sleeping on it at night and if the shoulder is immobile for too long she has to work it out to reactivate it again.  She has been able to run without any shoulder pain issues.  Continues to deny any radiation of the shoulder pain up in the neck or down the arm.  ROS:     See HPI  PERTINENT  PMH / PSH FH / / SH:  Past Medical, Surgical, Social, and Family History Reviewed & Updated in the EMR.  Pertinent findings include:  none  OBJECTIVE: BP (!) 141/91   Ht 5\' 4"  (1.626 m)   Wt 130 lb (59 kg)   LMP 05/03/2020   BMI 22.31 kg/m   Physical Exam:  Vital signs are reviewed.  GEN: Alert and oriented, NAD Pulm: Breathing unlabored PSY: normal mood, congruent affect  MSK: R Shoulder -no deformity.  No tenderness to palpation at [facility] joint or biceps in the bicipital groove.  Full painless range of motion in all directions.  5/5 strength with resisted supraspinatus that produces mild pain.  5/5 strength with resisted external rotation which also reproduces mild pain.  No pain with resisted internal rotation.  Negative Hawkins test.  Negative empty can test.  Negative O'Brien's.  Negative speeds.  Neurovascular intact  distally.  ASSESSMENT & PLAN:  1.  Right shoulder rotator cuff impingement, subsequent encounter  -Patient's overall clinical picture is improving.  I am hopeful she will continue to improve with her home exercise plan, NSAIDs as needed, and adjusted workspace with a more ergonomic keyboard.  Her follow-up will be open-ended.  If in 2 to 3 weeks, she still experiencing nighttime discomfort or the shoulder takes a turn for the worse, she can call to come back for a subacromial cortisone injection.  Dortha Kern, MD PGY-4, Sports Medicine Fellow [facility]  Patient seen and evaluated with the sports medicine fellow.  I agree with the above plan of care.  Patient is improving with home exercises.  No further work-up or treatment recommended today.  However, if her nighttime pain persist then she may return to the office for subacromial cortisone injection.  She will follow-up for ongoing or recalcitrant issues.

## 2022-05-18 DIAGNOSIS — J4530 Mild persistent asthma, uncomplicated: Secondary | ICD-10-CM | POA: Diagnosis not present

## 2022-05-18 DIAGNOSIS — Z23 Encounter for immunization: Secondary | ICD-10-CM | POA: Diagnosis not present

## 2022-05-18 DIAGNOSIS — Z0000 Encounter for general adult medical examination without abnormal findings: Secondary | ICD-10-CM | POA: Diagnosis not present

## 2022-05-18 DIAGNOSIS — E7800 Pure hypercholesterolemia, unspecified: Secondary | ICD-10-CM | POA: Diagnosis not present

## 2022-05-18 DIAGNOSIS — G43009 Migraine without aura, not intractable, without status migrainosus: Secondary | ICD-10-CM | POA: Diagnosis not present

## 2022-05-26 ENCOUNTER — Ambulatory Visit: Payer: BC Managed Care – PPO | Admitting: Obstetrics & Gynecology

## 2022-05-31 ENCOUNTER — Ambulatory Visit: Payer: BC Managed Care – PPO | Admitting: Obstetrics & Gynecology

## 2022-08-11 ENCOUNTER — Encounter: Payer: Self-pay | Admitting: Obstetrics & Gynecology

## 2022-08-11 ENCOUNTER — Ambulatory Visit (INDEPENDENT_AMBULATORY_CARE_PROVIDER_SITE_OTHER): Payer: BC Managed Care – PPO | Admitting: Obstetrics & Gynecology

## 2022-08-11 VITALS — BP 116/72 | HR 69 | Ht 63.75 in | Wt 136.0 lb

## 2022-08-11 DIAGNOSIS — Z79890 Hormone replacement therapy (postmenopausal): Secondary | ICD-10-CM | POA: Diagnosis not present

## 2022-08-11 DIAGNOSIS — Z8619 Personal history of other infectious and parasitic diseases: Secondary | ICD-10-CM

## 2022-08-11 DIAGNOSIS — Z01419 Encounter for gynecological examination (general) (routine) without abnormal findings: Secondary | ICD-10-CM | POA: Diagnosis not present

## 2022-08-11 MED ORDER — VALACYCLOVIR HCL 500 MG PO TABS: ORAL_TABLET | ORAL | 4 refills | Status: DC

## 2022-08-11 NOTE — Progress Notes (Signed)
Anne Mann Jan 19, 1961 [id number]   History:    62 y.o. G3P2A1L2 Stable partner   RP:  Established patient presenting for annual gyn exam    HPI: Postmenopausal on HRT with Vivelle 0.0375 mg patch twice weekly and Prometrium 100 mg at bedtime.  No PMB.  No pelvic pain. 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 Pap smear/HPV Neg 2018.  Pap Neg 05/2021.  Will repeat Pap at 3 years.  Breasts normal. Mammo Bilateral 02-04-22, left Dx mammo/breast u/s & left breast biopsy 02-10-22: Patho benign, fibroadenoma and fibrocystic disease.  Repeat Bilateral Screening mammo 01/2023.   Colonoscopy 11/2020.  HSV with occasional outbreaks.  Uses Valtrex 500 mg daily with good effect.  BMI 23.53.  Good fitness, healthy nutrition. Flu vaccine done at pcp.  Past medical history,surgical history, family history and social history were all reviewed and documented in the EPIC chart.  Gynecologic History Patient's last menstrual period was 05/03/2020.  Obstetric History OB History  Gravida Para Term Preterm AB Living  3 2 2   1 2   SAB IAB Ectopic Multiple Live Births  1            # Outcome Date GA Lbr Len/2nd Weight Sex Delivery Anes PTL Lv  3 SAB           2 Term           1 Term              ROS: A ROS was performed and pertinent positives and negatives are included in the history. GENERAL: No fevers or chills. HEENT: No change in vision, no earache, sore throat or sinus congestion. NECK: No pain or stiffness. CARDIOVASCULAR: No chest pain or pressure. No palpitations. PULMONARY: No shortness of breath, cough or wheeze. GASTROINTESTINAL: No abdominal pain, nausea, vomiting or diarrhea, melena or bright red blood per rectum. GENITOURINARY: No urinary frequency, urgency, hesitancy or dysuria. MUSCULOSKELETAL: No joint or muscle pain, no back pain, no recent trauma. DERMATOLOGIC: No rash, no itching, no lesions. ENDOCRINE: No polyuria, polydipsia, no heat or cold intolerance. No recent change  in weight. HEMATOLOGICAL: No anemia or easy bruising or bleeding. NEUROLOGIC: No headache, seizures, numbness, tingling or weakness. PSYCHIATRIC: No depression, no loss of interest in normal activity or change in sleep pattern.     Exam:   BP 116/72   Pulse 69   Ht 5' 3.75" (1.619 m)   Wt 136 lb (61.7 kg)   LMP 05/03/2020   SpO2 97%   BMI 23.53 kg/m   Body mass index is 23.53 kg/m.  General appearance : Well developed well nourished female. No acute distress HEENT: Eyes: no retinal hemorrhage or exudates,  Neck supple, trachea midline, no carotid bruits, no thyroidmegaly Lungs: Clear to auscultation, no rhonchi or wheezes, or rib retractions  Heart: Regular rate and rhythm, no murmurs or gallops Breast:Examined in sitting and supine position were symmetrical in appearance, no palpable masses or tenderness,  no skin retraction, no nipple inversion, no nipple discharge, no skin discoloration, no axillary or supraclavicular lymphadenopathy Abdomen: no palpable masses or tenderness, no rebound or guarding Extremities: no edema or skin discoloration or tenderness  Pelvic: Vulva: Normal             Vagina: No gross lesions or discharge  Cervix: No gross lesions or discharge  Uterus  AV, normal size, shape and consistency, non-tender and mobile  Adnexa  Without masses or tenderness  Anus: Normal  Assessment/Plan:  62 y.o. female for annual exam   1. Well female exam with routine gynecological exam Postmenopausal on HRT with Vivelle 0.0375 mg patch twice weekly and Prometrium 100 mg at bedtime.  No PMB.  No pelvic pain. 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 Pap smear/HPV Neg 2018.  Pap Neg 05/2021.  Will repeat Pap at 3 years.  Breasts normal. Mammo Bilateral 02-04-22, left Dx mammo/breast u/s & left breast biopsy 02-10-22: Patho benign, fibroadenoma and fibrocystic disease.  Repeat Bilateral Screening mammo 01/2023.   Colonoscopy 11/2020.  HSV with occasional  outbreaks.  Uses Valtrex 500 mg daily with good effect.  BMI 23.53.  Good fitness, healthy nutrition. Flu vaccine done at pcp.  2. Postmenopausal hormone replacement therapy Postmenopausal on HRT x >10 years, with Vivelle 0.0375 mg patch twice weekly and Prometrium 100 mg at bedtime.  No PMB.  No pelvic pain.  Will wean to 1/2 patch until runs out.  Continue Prometrium 100 mg PO HS until stops the Estradiol patch.  3. History of herpes genitalis Continue on Valacyclovir prophylaxis, prescription sent to pharmacy.  Other orders - valACYclovir (VALTREX) 500 MG tablet; Take one pill daily.  Increase to 2 pills daily with outbreak   Princess Bruins MD, 3:48 PM

## 2022-09-09 ENCOUNTER — Ambulatory Visit: Payer: BC Managed Care – PPO | Admitting: Sports Medicine

## 2022-09-09 VITALS — BP 138/84 | Ht 64.0 in | Wt 130.0 lb

## 2022-09-09 DIAGNOSIS — M7541 Impingement syndrome of right shoulder: Secondary | ICD-10-CM

## 2022-09-09 MED ORDER — METHYLPREDNISOLONE ACETATE 40 MG/ML IJ SUSP
40.0000 mg | Freq: Once | INTRAMUSCULAR | Status: AC
  Administered 2022-09-09: 40 mg via INTRA_ARTICULAR

## 2022-09-09 NOTE — Progress Notes (Signed)
   Subjective:    Patient ID: Anne Mann, female    DOB: [birth year], 62 y.o.   MRN: [id number]  HPI chief complaint: Right shoulder pain  Patient presents today with persistent right shoulder pain.  She was last seen in the office in October.  Diagnosed with rotator cuff impingement at that time, she was making some improvement at that visit.  However, her improvement has plateaued.  She still has pain at night.  Will also experience intermittent pain throughout the day.  She has not noticed any weakness.  Pain continues to localize to the lateral shoulder.    Review of Systems As above    Objective:   Physical Exam  Well-developed, well-nourished.  No acute distress  Right shoulder: Full painless range of motion.  No tenderness to palpation.  Positive empty can, positive Hawkins.  Rotator cuff strength is 5/5 but does reproduce pain with resisted supraspinatus and external rotation.  Neurovascularly intact distally.      Assessment & Plan:   Persistent right shoulder pain secondary to subacromial bursitis versus rotator cuff tendinopathy  Right subacromial space is injected with cortisone today.  Patient tolerates this without difficulty.  We will couple this with formal physical therapy to be done at [facility].  If symptoms persist for another 3 to 4 weeks then consider imaging at that time.  Follow-up for ongoing or recalcitrant issues.  Consent obtained and verified. Time-out conducted. Noted no overlying erythema, induration, or other signs of local infection. Skin prepped in a sterile fashion. Topical analgesic spray: Ethyl chloride. Joint: Right shoulder, subacromial space Needle: 25-gauge 1.5 inch Completed without difficulty. Meds: 3 cc 1% Xylocaine, 1 cc (40 mg) Depo-Medrol  Advised to call if fevers/chills, erythema, induration, drainage, or persistent bleeding.

## 2022-09-09 NOTE — Patient Instructions (Signed)
[facility] [address] [place], [place] [address] [phone]

## 2022-11-25 ENCOUNTER — Encounter: Payer: Self-pay | Admitting: Family Medicine

## 2022-11-25 DIAGNOSIS — R1012 Left upper quadrant pain: Secondary | ICD-10-CM | POA: Diagnosis not present

## 2022-11-26 ENCOUNTER — Other Ambulatory Visit: Payer: Self-pay | Admitting: Family Medicine

## 2022-11-26 DIAGNOSIS — R1012 Left upper quadrant pain: Secondary | ICD-10-CM

## 2022-12-08 ENCOUNTER — Other Ambulatory Visit: Payer: BC Managed Care – PPO

## 2022-12-21 DIAGNOSIS — D692 Other nonthrombocytopenic purpura: Secondary | ICD-10-CM | POA: Diagnosis not present

## 2022-12-21 DIAGNOSIS — L578 Other skin changes due to chronic exposure to nonionizing radiation: Secondary | ICD-10-CM | POA: Diagnosis not present

## 2022-12-23 DIAGNOSIS — R1012 Left upper quadrant pain: Secondary | ICD-10-CM | POA: Diagnosis not present

## 2022-12-23 DIAGNOSIS — R1032 Left lower quadrant pain: Secondary | ICD-10-CM | POA: Diagnosis not present

## 2023-01-18 DIAGNOSIS — R109 Unspecified abdominal pain: Secondary | ICD-10-CM | POA: Diagnosis not present

## 2023-01-26 DIAGNOSIS — R030 Elevated blood-pressure reading, without diagnosis of hypertension: Secondary | ICD-10-CM | POA: Diagnosis not present

## 2023-01-26 DIAGNOSIS — R110 Nausea: Secondary | ICD-10-CM | POA: Diagnosis not present

## 2023-01-26 DIAGNOSIS — B379 Candidiasis, unspecified: Secondary | ICD-10-CM | POA: Diagnosis not present

## 2023-01-26 DIAGNOSIS — J0100 Acute maxillary sinusitis, unspecified: Secondary | ICD-10-CM | POA: Diagnosis not present

## 2023-02-10 DIAGNOSIS — J0100 Acute maxillary sinusitis, unspecified: Secondary | ICD-10-CM | POA: Diagnosis not present

## 2023-02-10 DIAGNOSIS — R030 Elevated blood-pressure reading, without diagnosis of hypertension: Secondary | ICD-10-CM | POA: Diagnosis not present

## 2023-02-21 DIAGNOSIS — J329 Chronic sinusitis, unspecified: Secondary | ICD-10-CM | POA: Diagnosis not present

## 2023-02-21 DIAGNOSIS — R030 Elevated blood-pressure reading, without diagnosis of hypertension: Secondary | ICD-10-CM | POA: Diagnosis not present

## 2023-02-23 DIAGNOSIS — R1012 Left upper quadrant pain: Secondary | ICD-10-CM | POA: Diagnosis not present

## 2023-02-23 DIAGNOSIS — K2930 Chronic superficial gastritis without bleeding: Secondary | ICD-10-CM | POA: Diagnosis not present

## 2023-02-23 DIAGNOSIS — K259 Gastric ulcer, unspecified as acute or chronic, without hemorrhage or perforation: Secondary | ICD-10-CM | POA: Diagnosis not present

## 2023-02-23 DIAGNOSIS — K449 Diaphragmatic hernia without obstruction or gangrene: Secondary | ICD-10-CM | POA: Diagnosis not present

## 2023-03-04 ENCOUNTER — Other Ambulatory Visit: Payer: Self-pay

## 2023-03-04 MED ORDER — VALACYCLOVIR HCL 500 MG PO TABS: ORAL_TABLET | ORAL | 1 refills | Status: DC

## 2023-03-04 NOTE — Telephone Encounter (Signed)
Med refill request: Valtrex Last AEX: 08/11/22 Next AEX: not scheduled Last MMG (if hormonal med) 02/18/22 Refill authorized: Please Advise, #90, 4 RF

## 2023-03-15 ENCOUNTER — Other Ambulatory Visit: Payer: Self-pay

## 2023-03-15 DIAGNOSIS — Z8619 Personal history of other infectious and parasitic diseases: Secondary | ICD-10-CM

## 2023-03-15 MED ORDER — VALACYCLOVIR HCL 500 MG PO TABS: ORAL_TABLET | ORAL | 1 refills | Status: DC

## 2023-03-15 NOTE — Telephone Encounter (Signed)
Med refill request: Valacyclovir 500mg  #90 Patient has new mail order pharmacy. [facility] Last AEX: 08/11/22 with Dr. Seymour Bars Refill authorized:

## 2023-04-25 DIAGNOSIS — K2900 Acute gastritis without bleeding: Secondary | ICD-10-CM | POA: Diagnosis not present

## 2023-04-25 DIAGNOSIS — K219 Gastro-esophageal reflux disease without esophagitis: Secondary | ICD-10-CM | POA: Diagnosis not present

## 2023-04-27 DIAGNOSIS — Z1231 Encounter for screening mammogram for malignant neoplasm of breast: Secondary | ICD-10-CM | POA: Diagnosis not present

## 2023-04-28 ENCOUNTER — Encounter: Payer: Self-pay | Admitting: Obstetrics and Gynecology

## 2023-05-06 ENCOUNTER — Ambulatory Visit: Payer: BC Managed Care – PPO | Admitting: Obstetrics and Gynecology

## 2023-05-06 ENCOUNTER — Encounter: Payer: Self-pay | Admitting: Obstetrics and Gynecology

## 2023-05-06 VITALS — BP 132/86 | HR 71 | Ht 64.0 in | Wt 137.0 lb

## 2023-05-06 DIAGNOSIS — R232 Flushing: Secondary | ICD-10-CM | POA: Diagnosis not present

## 2023-05-06 DIAGNOSIS — S76211A Strain of adductor muscle, fascia and tendon of right thigh, initial encounter: Secondary | ICD-10-CM

## 2023-05-06 DIAGNOSIS — S76219A Strain of adductor muscle, fascia and tendon of unspecified thigh, initial encounter: Secondary | ICD-10-CM | POA: Insufficient documentation

## 2023-05-06 MED ORDER — VEOZAH 45 MG PO TABS: 1.0000 | ORAL_TABLET | Freq: Every day | ORAL | 0 refills | Status: AC

## 2023-05-06 NOTE — Progress Notes (Signed)
62 y.o. [id number] postmenopausal female here for VMS and right groin pain. Discontinued HRT this year at annual exam in January.  Lower right groin pain and hot flashes-- Pt reported driving to [place] in August and since then, pt has had groin pain radiating through her thigh and hip. Pain gets better with rest. Worse with bending. Not aggravated with walking, stretching or lifting leg. Now noticing discomfort along right labia with sitting. Cannot ride stationary bike.   Last mammogram: 04/27/23 Last colonoscopy: May 2022, Endoscopy 2024 Sexually active: Yes  Exercising: Yes, running, walking, yoga, interval training Last Pap: 05/20/21   OB History  Gravida Para Term Preterm AB Living  3 2 2   1 2   SAB IAB Ectopic Multiple Live Births  1            # Outcome Date GA Lbr Len/2nd Weight Sex Type Anes PTL Lv  3 SAB           2 Term           1 Term             Past Medical History:  Diagnosis Date   Asthma    EXERCISE INDUCED   Cervical dysplasia 1997   Complication of anesthesia    Heart murmur    HSV-2 infection    Migraines    PONV (postoperative nausea and vomiting)    Stress fracture    UPJ (ureteropelvic junction) obstruction    Ureteropelvic junction obstruction    LEFT    Past Surgical History:  Procedure Laterality Date   CERVICAL BIOPSY  W/ LOOP ELECTRODE EXCISION  1997   lgsil   CYSTOSCOPY W/ RETROGRADES Left 05/16/2015   Procedure: CYSTOSCOPY WITH LEFT RETROGRADE PYELOGRAM, STENT PLACEMENT;  Surgeon: Crist Fat, MD;  Location: [facility];  Service: Urology;  Laterality: Left;   Mirena     Inserted 06/11/2014   ROBOT ASSISTED PYELOPLASTY Left 05/16/2015   Procedure: ROBOTIC ASSISTED LAPAROSCOPIC LEFT PYELOPLASTY;  Surgeon: Crist Fat, MD;  Location: [facility];  Service: Urology;  Laterality: Left;   TMJ ARTHROSCOPY  1991    Current Outpatient Medications on File Prior to Visit  Medication Sig Dispense Refill   acetaminophen (TYLENOL) 500 MG  tablet Take 500-1,000 mg by mouth every 6 (six) hours as needed for headache.     loratadine (CLARITIN) 10 MG tablet Take 10 mg by mouth daily.     montelukast (SINGULAIR) 10 MG tablet Take 10 mg by mouth at bedtime.     PROVENTIL HFA 108 (90 BASE) MCG/ACT inhaler Inhale 2 puffs into the lungs every 4 (four) hours as needed for wheezing.      SYMBICORT 160-4.5 MCG/ACT inhaler Inhale 2 puffs into the lungs 2 (two) times daily.   10   valACYclovir (VALTREX) 500 MG tablet Take one pill (500 mg) daily for prevention.  Increase to one pill twice a day for three days for an outbreak. 90 tablet 1   pantoprazole (PROTONIX) 40 MG tablet Take 40 mg by mouth 2 (two) times daily. (Patient not taking: Reported on 05/06/2023)     No current facility-administered medications on file prior to visit.    Allergies  Allergen Reactions   Codeine Nausea And Vomiting      PE Today's Vitals   05/06/23 1046  BP: 132/86  Pulse: 71  SpO2: 98%  Weight: 137 lb (62.1 kg)  Height: 5\' 4"  (1.626 m)   Body mass index is  23.52 kg/m.  Physical Exam Vitals reviewed.  Constitutional:      General: She is not in acute distress.    Appearance: Normal appearance.  HENT:     Head: Normocephalic and atraumatic.     Nose: Nose normal.  Eyes:     Extraocular Movements: Extraocular movements intact.     Conjunctiva/sclera: Conjunctivae normal.  Pulmonary:     Effort: Pulmonary effort is normal.  Musculoskeletal:        General: Normal range of motion.     Cervical back: Normal range of motion.  Neurological:     General: No focal deficit present.     Mental Status: She is alert.  Psychiatric:        Mood and Affect: Mood normal.        Behavior: Behavior normal.       Assessment and Plan:        Hot flash not due to menopause Assessment & Plan: Breast cancer risk: Gail model assessment performed 1.9%, 9.5%, average. CV risk assessment: 3% over next 74yr, average Discussed extended use of HRT in  patients with average CV and breast cancer risk. Also discussed nonhormonal options for VMS, including SSRIs and veozah.  After discussion the patient opted to try veozah. Allyne Gee is contraindicated in patient with liver dysfunction and requires q3 months CMP. Side effects include abdominal pain, diarrhea, back pain, and insomnia.  CMP today RTO in 3 months to reassess sx and CMP at annual. All questions answered.  Orders: -     COMPLETE METABOLIC PANEL WITH GFR -     Veozah; Take 1 tablet (45 mg total) by mouth daily.  Dispense: 90 tablet; Refill: 0  Inguinal strain, right, initial encounter Assessment & Plan: Suspect muscle sprain or tendonitis involving groin PFPT referral placed Will f/u at annual exam  Orders: -     Ambulatory referral to Physical Therapy     Rosalyn Gess, MD

## 2023-05-06 NOTE — Assessment & Plan Note (Addendum)
Suspect muscle sprain or tendonitis involving groin PFPT referral placed Will f/u at annual exam

## 2023-05-06 NOTE — Patient Instructions (Signed)
Consider using ibuprofen 600mg  TID with meals x5d for groin pain.

## 2023-05-06 NOTE — Assessment & Plan Note (Addendum)
Breast cancer risk: Dondra Spry model assessment performed 1.9%, 9.5%, average. CV risk assessment: 3% over next 71yr, average Discussed extended use of HRT in patients with average CV and breast cancer risk. Also discussed nonhormonal options for VMS, including SSRIs and veozah.  After discussion the patient opted to try veozah. Allyne Gee is contraindicated in patient with liver dysfunction and requires q3 months CMP. Side effects include abdominal pain, diarrhea, back pain, and insomnia.  CMP today RTO in 3 months to reassess sx and CMP at annual. All questions answered.

## 2023-05-07 LAB — COMPLETE METABOLIC PANEL WITH GFR
AG Ratio: 1.8 (calc) (ref 1.0–2.5)
ALT: 23 U/L (ref 6–29)
AST: 18 U/L (ref 10–35)
Albumin: 4.5 g/dL (ref 3.6–5.1)
Alkaline phosphatase (APISO): 103 U/L (ref 37–153)
BUN: 11 mg/dL (ref 7–25)
CO2: 29 mmol/L (ref 20–32)
Calcium: 10.1 mg/dL (ref 8.6–10.4)
Chloride: 104 mmol/L (ref 98–110)
Creat: 0.81 mg/dL (ref 0.50–1.05)
Globulin: 2.5 g/dL (ref 1.9–3.7)
Glucose, Bld: 75 mg/dL (ref 65–99)
Potassium: 4.4 mmol/L (ref 3.5–5.3)
Sodium: 140 mmol/L (ref 135–146)
Total Bilirubin: 0.5 mg/dL (ref 0.2–1.2)
Total Protein: 7 g/dL (ref 6.1–8.1)
eGFR: 83 mL/min/{1.73_m2} (ref >=60)

## 2023-05-11 NOTE — Therapy (Signed)
OUTPATIENT PHYSICAL THERAPY LOWER EXTREMITY EVALUATION   Patient Name: Anne Mann MRN: [id number] DOB:11/20/1960, 62 y.o., female Today's Date: 05/11/2023  END OF SESSION:   Past Medical History:  Diagnosis Date   Asthma    EXERCISE INDUCED   Cervical dysplasia 1997   Complication of anesthesia    Heart murmur    HSV-2 infection    Migraines    PONV (postoperative nausea and vomiting)    Stress fracture    UPJ (ureteropelvic junction) obstruction    Ureteropelvic junction obstruction    LEFT   Past Surgical History:  Procedure Laterality Date   CERVICAL BIOPSY  W/ LOOP ELECTRODE EXCISION  1997   lgsil   CYSTOSCOPY W/ RETROGRADES Left 05/16/2015   Procedure: CYSTOSCOPY WITH LEFT RETROGRADE PYELOGRAM, STENT PLACEMENT;  Surgeon: Crist Fat, MD;  Location: [facility];  Service: Urology;  Laterality: Left;   Mirena     Inserted 06/11/2014   ROBOT ASSISTED PYELOPLASTY Left 05/16/2015   Procedure: ROBOTIC ASSISTED LAPAROSCOPIC LEFT PYELOPLASTY;  Surgeon: Crist Fat, MD;  Location: [facility];  Service: Urology;  Laterality: Left;   TMJ ARTHROSCOPY  1991   Patient Active Problem List   Diagnosis Date Noted   Hot flash not due to menopause 05/06/2023   Groin strain 05/06/2023   Left foot pain 07/19/2017   Ureteropelvic junction (UPJ) obstruction 05/16/2015   Migraines    MUSCLE STRAIN, HAMSTRING MUSCLE 05/20/2010    PCP: Merri Brunette, MD  REFERRING PROVIDER: Rosalyn Gess, MD  REFERRING DIAG: [phone] (ICD-10-CM) - Inguinal strain, right, initial encounter  THERAPY DIAG:  No diagnosis found.  Rationale for Evaluation and Treatment: Rehabilitation  ONSET DATE: ***  SUBJECTIVE:   SUBJECTIVE STATEMENT: ***  PERTINENT HISTORY: Exercise induced asthma; migraines  PAIN:  Are you having pain? Yes: NPRS scale: ***/10 Pain location: *** Pain description: *** Aggravating factors: *** Relieving factors: ***  PRECAUTIONS: Other: exercise induced  asthma  RED FLAGS: {PT Red Flags:29287}   WEIGHT BEARING RESTRICTIONS: {Yes ***/No:24003}  FALLS:  Has patient fallen in last 6 months? {fallsyesno:27318}  LIVING ENVIRONMENT: Lives with: {[facility] lives with:25569::"lives with their family"} Lives in: {Lives in:25570} Stairs: {opstairs:27293} Has following equipment at home: {Assistive devices:23999}  OCCUPATION: ***  PLOF: {PLOF:24004}  PATIENT GOALS: ***  NEXT MD VISIT: ***  OBJECTIVE:  Note: Objective measures were completed at Evaluation unless otherwise noted.  DIAGNOSTIC FINDINGS: ***  PATIENT SURVEYS:  {rehab surveys:24030}  COGNITION: Overall cognitive status: {cognition:24006}     SENSATION: {sensation:27233}  EDEMA:  {edema:24020}  MUSCLE LENGTH: Hamstrings: Right *** deg; Left *** deg Maisie Fus test: Right *** deg; Left *** deg  POSTURE: {posture:25561}  PALPATION: ***  LOWER EXTREMITY ROM:  {AROM/PROM:27142} ROM Right eval Left eval  Hip flexion    Hip extension    Hip abduction    Hip adduction    Hip internal rotation    Hip external rotation    Knee flexion    Knee extension    Ankle dorsiflexion    Ankle plantarflexion    Ankle inversion    Ankle eversion     (Blank rows = not tested)  LOWER EXTREMITY MMT:  MMT Right eval Left eval  Hip flexion    Hip extension    Hip abduction    Hip adduction    Hip internal rotation    Hip external rotation    Knee flexion    Knee extension    Ankle dorsiflexion    Ankle plantarflexion  Ankle inversion    Ankle eversion     (Blank rows = not tested)  LOWER EXTREMITY SPECIAL TESTS:  {LEspecialtests:26242}  FUNCTIONAL TESTS:  {Functional tests:24029}  GAIT: Distance walked: *** Assistive device utilized: {Assistive devices:23999} Level of assistance: {Levels of assistance:24026} Comments: ***   TODAY'S TREATMENT:                                                                                                                               DATE: ***    PATIENT EDUCATION:  Education details: *** Person educated: {Person educated:25204} Education method: {Education Method:25205} Education comprehension: {Education Comprehension:25206}  HOME EXERCISE PROGRAM: ***  ASSESSMENT:  CLINICAL IMPRESSION: Patient is a *** y.o. *** who was seen today for physical therapy evaluation and treatment for ***.   OBJECTIVE IMPAIRMENTS: {opptimpairments:25111}.   ACTIVITY LIMITATIONS: {activitylimitations:27494}  PARTICIPATION LIMITATIONS: {participationrestrictions:25113}  PERSONAL FACTORS: {Personal factors:25162} are also affecting patient's functional outcome.   REHAB POTENTIAL: {rehabpotential:25112}  CLINICAL DECISION MAKING: {clinical decision making:25114}  EVALUATION COMPLEXITY: {Evaluation complexity:25115}   GOALS: Goals reviewed with patient? {yes/no:20286}  SHORT TERM GOALS: Target date: *** *** Baseline: Goal status: INITIAL  2.  *** Baseline:  Goal status: INITIAL  3.  *** Baseline:  Goal status: INITIAL  4.  *** Baseline:  Goal status: INITIAL  5.  *** Baseline:  Goal status: INITIAL  6.  *** Baseline:  Goal status: INITIAL  LONG TERM GOALS: Target date: ***  *** Baseline:  Goal status: INITIAL  2.  *** Baseline:  Goal status: INITIAL  3.  *** Baseline:  Goal status: INITIAL  4.  *** Baseline:  Goal status: INITIAL  5.  *** Baseline:  Goal status: INITIAL  6.  *** Baseline:  Goal status: INITIAL   PLAN:  PT FREQUENCY: {rehab frequency:25116}  PT DURATION: {rehab duration:25117}  PLANNED INTERVENTIONS: {rehab planned interventions:25118::"97146- PT Re-evaluation","97110-Therapeutic exercises","97530- Therapeutic [id number]- Neuromuscular re-education","97535- Self JXBJ","47829- Manual therapy","Patient/Family education"}  PLAN FOR NEXT SESSION: Claude Manges, PT 05/11/2023, 6:38 PM

## 2023-05-12 ENCOUNTER — Other Ambulatory Visit: Payer: Self-pay

## 2023-05-12 ENCOUNTER — Encounter: Payer: Self-pay | Admitting: Physical Therapy

## 2023-05-12 ENCOUNTER — Ambulatory Visit: Payer: BC Managed Care – PPO | Attending: Obstetrics and Gynecology | Admitting: Physical Therapy

## 2023-05-12 DIAGNOSIS — M6281 Muscle weakness (generalized): Secondary | ICD-10-CM | POA: Diagnosis not present

## 2023-05-12 DIAGNOSIS — M25551 Pain in right hip: Secondary | ICD-10-CM | POA: Insufficient documentation

## 2023-05-12 DIAGNOSIS — S76211A Strain of adductor muscle, fascia and tendon of right thigh, initial encounter: Secondary | ICD-10-CM | POA: Diagnosis not present

## 2023-05-12 DIAGNOSIS — M62838 Other muscle spasm: Secondary | ICD-10-CM | POA: Insufficient documentation

## 2023-06-02 ENCOUNTER — Encounter (HOSPITAL_COMMUNITY): Payer: Self-pay

## 2023-06-02 ENCOUNTER — Ambulatory Visit (INDEPENDENT_AMBULATORY_CARE_PROVIDER_SITE_OTHER): Payer: BC Managed Care – PPO

## 2023-06-02 ENCOUNTER — Ambulatory Visit (HOSPITAL_COMMUNITY)
Admission: RE | Admit: 2023-06-02 | Discharge: 2023-06-02 | Disposition: A | Discharge status: Home / Self Care | Payer: BC Managed Care – PPO | Source: Ambulatory Visit | Attending: Family Medicine | Admitting: Family Medicine

## 2023-06-02 VITALS — BP 159/83 | HR 74 | Temp 98.0°F | Resp 17

## 2023-06-02 DIAGNOSIS — S52501A Unspecified fracture of the lower end of right radius, initial encounter for closed fracture: Secondary | ICD-10-CM | POA: Diagnosis not present

## 2023-06-02 NOTE — Progress Notes (Addendum)
Orthopedic Tech Progress Note Patient Details:  Anne Mann 11-01-1960 [id number]  Well-padded sugar tong splint applied to RUE. ROM and sensation of digits remain intact. Ice and elevation were encouraged to help with any pain/swelling. Urgent care staff will be providing the sling.  Ortho Devices Type of Ortho Device: Sugartong splint, Cotton web roll, Ace wrap Ortho Device/Splint Location: RUE Ortho Device/Splint Interventions: Ordered, Application, Adjustment   Post Interventions Patient Tolerated: Well Instructions Provided: Care of device, Adjustment of device  Tiah Heckel Carmine Savoy 06/02/2023, 9:57 AM

## 2023-06-02 NOTE — Discharge Instructions (Signed)
You have a fracture of the radius bone in your wrist.  Please call the orthopedic specialist for an appointment

## 2023-06-02 NOTE — ED Triage Notes (Signed)
Pt reports fell last Thursday and hurt right wrist. Pt been wearing a wrist brace got OTC. Pt reports still hurting at times. Pt can make a fist and has good rotation with wrist.

## 2023-06-02 NOTE — ED Provider Notes (Signed)
[facility]    CSN: [id number] Arrival date & time: 06/02/23  3086      History   Chief Complaint Chief Complaint  Patient presents with   appt [id number]    HPI Anne Mann is a 62 y.o. female.   HPI Here for right wrist pain.  1 week ago she was trying to help her daughter's dog and she fell over him and onto her right wrist, which she had flexed at the time.  She has been wearing a brace and it is feeling some better.  It is still hurting a good bit over the ulnar side and on the volar side.  There is a lot of bruising on the proximal forearm.  She is allergic to codeine  Last menstrual cycle was 3 years ago Past Medical History:  Diagnosis Date   Asthma    EXERCISE INDUCED   Cervical dysplasia 1997   Complication of anesthesia    Heart murmur    HSV-2 infection    Migraines    PONV (postoperative nausea and vomiting)    Stress fracture    UPJ (ureteropelvic junction) obstruction    Ureteropelvic junction obstruction    LEFT    Patient Active Problem List   Diagnosis Date Noted   Hot flash not due to menopause 05/06/2023   Groin strain 05/06/2023   Left foot pain 07/19/2017   Ureteropelvic junction (UPJ) obstruction 05/16/2015   Migraines    MUSCLE STRAIN, HAMSTRING MUSCLE 05/20/2010    Past Surgical History:  Procedure Laterality Date   CERVICAL BIOPSY  W/ LOOP ELECTRODE EXCISION  1997   lgsil   CYSTOSCOPY W/ RETROGRADES Left 05/16/2015   Procedure: CYSTOSCOPY WITH LEFT RETROGRADE PYELOGRAM, STENT PLACEMENT;  Surgeon: Crist Fat, MD;  Location: [facility];  Service: Urology;  Laterality: Left;   Mirena     Inserted 06/11/2014   ROBOT ASSISTED PYELOPLASTY Left 05/16/2015   Procedure: ROBOTIC ASSISTED LAPAROSCOPIC LEFT PYELOPLASTY;  Surgeon: Crist Fat, MD;  Location: [facility];  Service: Urology;  Laterality: Left;   TMJ ARTHROSCOPY  1991    OB History     Gravida  3   Para  2   Term  2   Preterm      AB  1   Living  2       SAB  1   IAB      Ectopic      Multiple      Live Births               Home Medications    Prior to Admission medications   Medication Sig Start Date End Date Taking? Authorizing Provider  acetaminophen (TYLENOL) 500 MG tablet Take 500-1,000 mg by mouth every 6 (six) hours as needed for headache.    [provider]  Fezolinetant (VEOZAH) 45 MG TABS Take 1 tablet (45 mg total) by mouth daily. 05/06/23 08/04/23  Rosalyn Gess, MD  loratadine (CLARITIN) 10 MG tablet Take 10 mg by mouth daily.    [provider]  montelukast (SINGULAIR) 10 MG tablet Take 10 mg by mouth at bedtime.    [provider]  pantoprazole (PROTONIX) 40 MG tablet Take 40 mg by mouth 2 (two) times daily. Patient not taking: Reported on 05/06/2023 02/23/23   [provider]  PROVENTIL HFA 108 (90 BASE) MCG/ACT inhaler Inhale 2 puffs into the lungs every 4 (four) hours as needed for wheezing.  01/24/14  [provider]  SYMBICORT 160-4.5 MCG/ACT inhaler Inhale 2 puffs into the lungs 2 (two) times daily.  05/13/14   [provider]  valACYclovir (VALTREX) 500 MG tablet Take one pill (500 mg) daily for prevention.  Increase to one pill twice a day for three days for an outbreak. 03/15/23   Chrzanowski, Lamona Curl, NP    Family History Family History  Problem Relation Age of Onset   Thyroid disease Mother    Heart failure Father     Social History Social History   Tobacco Use   Smoking status: Never   Smokeless tobacco: Never  Vaping Use   Vaping status: Never Used  Substance Use Topics   Alcohol use: No    Alcohol/week: 0.0 standard drinks of alcohol   Drug use: No     Allergies   Codeine   Review of Systems Review of Systems   Physical Exam Triage Vital Signs ED Triage Vitals [06/02/23 0840]  Encounter Vitals Group     BP (!) 159/83     Systolic BP Percentile      Diastolic BP Percentile      Pulse Rate 74     Resp 17     Temp  98 F (36.7 C)     Temp Source Oral     SpO2 98 %     Weight      Height      Head Circumference      Peak Flow      Pain Score 5     Pain Loc      Pain Education      Exclude from Growth Chart    No data found.  Updated Vital Signs BP (!) 159/83 (BP Location: Left Arm)   Pulse 74   Temp 98 F (36.7 C) (Oral)   Resp 17   LMP 05/03/2020   SpO2 98%   Visual Acuity Right Eye Distance:   Left Eye Distance:   Bilateral Distance:    Right Eye Near:   Left Eye Near:    Bilateral Near:     Physical Exam Vitals reviewed.  Constitutional:      General: She is not in acute distress.    Appearance: She is not ill-appearing, toxic-appearing or diaphoretic.  Musculoskeletal:     Comments: There is some mild swelling over the volar surface of the right wrist with some green and purple ecchymosis.  The swelling extends over the ulnar side of the wrist.  There is ecchymosis that is purple down into her proximal forearm.  Capillary refill and neurologic exam is intact distally.  She is able to flex and extend her fingers and she can wiggle at the wrist joint.  Skin:    Coloration: Skin is not jaundiced or pale.  Neurological:     Mental Status: She is alert and oriented to person, place, and time.  Psychiatric:        Behavior: Behavior normal.      UC Treatments / Results  Labs (all labs ordered are listed, but only abnormal results are displayed) Labs Reviewed - No data to display  EKG   Radiology No results found.  Procedures Procedures (including critical care time)  Medications Ordered in UC Medications - No data to display  Initial Impression / Assessment and Plan / UC Course  I have reviewed the triage vital signs and the nursing notes.  Pertinent labs & imaging results that were available during my care of the  patient were reviewed by me and considered in my medical decision making (see chart for details).      There is a fracture of the distal  radius.  Sugar-tong splint is applied.  She states she does not need anything extra for pain as she has been taking Tylenol with some relief.  She is going to continue taking the Tylenol  She will contact orthopedic/hand specialist for follow-up.   Final Clinical Impressions(s) / UC Diagnoses   Final diagnoses:  Closed fracture of distal end of right radius, unspecified fracture morphology, initial encounter     Discharge Instructions      You have a fracture of the radius bone in your wrist.  Please call the orthopedic specialist for an appointment       ED Prescriptions   None    PDMP not reviewed this encounter.   Zenia Resides, MD 06/02/23 [phone]

## 2023-06-03 DIAGNOSIS — S52501A Unspecified fracture of the lower end of right radius, initial encounter for closed fracture: Secondary | ICD-10-CM | POA: Diagnosis not present

## 2023-06-03 HISTORY — PX: WRIST FRACTURE SURGERY: SHX121

## 2023-06-08 DIAGNOSIS — S52501A Unspecified fracture of the lower end of right radius, initial encounter for closed fracture: Secondary | ICD-10-CM | POA: Diagnosis not present

## 2023-06-08 DIAGNOSIS — X58XXXA Exposure to other specified factors, initial encounter: Secondary | ICD-10-CM | POA: Diagnosis not present

## 2023-06-08 DIAGNOSIS — Y999 Unspecified external cause status: Secondary | ICD-10-CM | POA: Diagnosis not present

## 2023-06-08 DIAGNOSIS — S52551A Other extraarticular fracture of lower end of right radius, initial encounter for closed fracture: Secondary | ICD-10-CM | POA: Diagnosis not present

## 2023-06-21 DIAGNOSIS — S52501D Unspecified fracture of the lower end of right radius, subsequent encounter for closed fracture with routine healing: Secondary | ICD-10-CM | POA: Diagnosis not present

## 2023-07-05 DIAGNOSIS — M25631 Stiffness of right wrist, not elsewhere classified: Secondary | ICD-10-CM | POA: Diagnosis not present

## 2023-07-05 DIAGNOSIS — S52501D Unspecified fracture of the lower end of right radius, subsequent encounter for closed fracture with routine healing: Secondary | ICD-10-CM | POA: Diagnosis not present

## 2023-07-08 DIAGNOSIS — S52501D Unspecified fracture of the lower end of right radius, subsequent encounter for closed fracture with routine healing: Secondary | ICD-10-CM | POA: Diagnosis not present

## 2023-07-08 DIAGNOSIS — M25631 Stiffness of right wrist, not elsewhere classified: Secondary | ICD-10-CM | POA: Diagnosis not present

## 2023-07-13 DIAGNOSIS — M25631 Stiffness of right wrist, not elsewhere classified: Secondary | ICD-10-CM | POA: Diagnosis not present

## 2023-07-13 DIAGNOSIS — S52501D Unspecified fracture of the lower end of right radius, subsequent encounter for closed fracture with routine healing: Secondary | ICD-10-CM | POA: Diagnosis not present

## 2023-07-15 DIAGNOSIS — S52501D Unspecified fracture of the lower end of right radius, subsequent encounter for closed fracture with routine healing: Secondary | ICD-10-CM | POA: Diagnosis not present

## 2023-07-15 DIAGNOSIS — M25631 Stiffness of right wrist, not elsewhere classified: Secondary | ICD-10-CM | POA: Diagnosis not present

## 2023-07-18 DIAGNOSIS — S52501D Unspecified fracture of the lower end of right radius, subsequent encounter for closed fracture with routine healing: Secondary | ICD-10-CM | POA: Diagnosis not present

## 2023-07-18 DIAGNOSIS — M25631 Stiffness of right wrist, not elsewhere classified: Secondary | ICD-10-CM | POA: Diagnosis not present

## 2023-07-26 DIAGNOSIS — M25631 Stiffness of right wrist, not elsewhere classified: Secondary | ICD-10-CM | POA: Diagnosis not present

## 2023-07-26 DIAGNOSIS — S52501D Unspecified fracture of the lower end of right radius, subsequent encounter for closed fracture with routine healing: Secondary | ICD-10-CM | POA: Diagnosis not present

## 2023-07-29 DIAGNOSIS — S52501D Unspecified fracture of the lower end of right radius, subsequent encounter for closed fracture with routine healing: Secondary | ICD-10-CM | POA: Diagnosis not present

## 2023-07-29 DIAGNOSIS — M25631 Stiffness of right wrist, not elsewhere classified: Secondary | ICD-10-CM | POA: Diagnosis not present

## 2023-08-15 ENCOUNTER — Ambulatory Visit: Payer: BC Managed Care – PPO | Admitting: Obstetrics and Gynecology

## 2023-08-18 ENCOUNTER — Other Ambulatory Visit (HOSPITAL_COMMUNITY)
Admission: RE | Admit: 2023-08-18 | Discharge: 2023-08-18 | Disposition: A | Discharge status: Home / Self Care | Payer: BC Managed Care – PPO | Source: Ambulatory Visit | Attending: Obstetrics and Gynecology | Admitting: Obstetrics and Gynecology

## 2023-08-18 ENCOUNTER — Ambulatory Visit (INDEPENDENT_AMBULATORY_CARE_PROVIDER_SITE_OTHER): Payer: BC Managed Care – PPO | Admitting: Obstetrics and Gynecology

## 2023-08-18 ENCOUNTER — Encounter: Payer: Self-pay | Admitting: Obstetrics and Gynecology

## 2023-08-18 VITALS — BP 156/78 | HR 82 | Ht 64.17 in | Wt 139.0 lb

## 2023-08-18 DIAGNOSIS — Z8619 Personal history of other infectious and parasitic diseases: Secondary | ICD-10-CM | POA: Insufficient documentation

## 2023-08-18 DIAGNOSIS — Z9189 Other specified personal risk factors, not elsewhere classified: Secondary | ICD-10-CM | POA: Insufficient documentation

## 2023-08-18 DIAGNOSIS — Z01419 Encounter for gynecological examination (general) (routine) without abnormal findings: Secondary | ICD-10-CM | POA: Diagnosis not present

## 2023-08-18 DIAGNOSIS — Z124 Encounter for screening for malignant neoplasm of cervix: Secondary | ICD-10-CM

## 2023-08-18 DIAGNOSIS — B009 Herpesviral infection, unspecified: Secondary | ICD-10-CM

## 2023-08-18 DIAGNOSIS — N951 Menopausal and female climacteric states: Secondary | ICD-10-CM

## 2023-08-18 DIAGNOSIS — N898 Other specified noninflammatory disorders of vagina: Secondary | ICD-10-CM

## 2023-08-18 DIAGNOSIS — N958 Other specified menopausal and perimenopausal disorders: Secondary | ICD-10-CM

## 2023-08-18 LAB — WET PREP FOR TRICH, YEAST, CLUE

## 2023-08-18 MED ORDER — VALACYCLOVIR HCL 500 MG PO TABS: ORAL_TABLET | ORAL | 1 refills | Status: AC

## 2023-08-18 MED ORDER — ESTRADIOL 0.1 MG/GM VA CREA: TOPICAL_CREAM | VAGINAL | 1 refills | Status: AC

## 2023-08-18 NOTE — Assessment & Plan Note (Signed)
Cervical cancer screening performed according to ASCCP guidelines. Encouraged annual mammogram screening Colonoscopy UTD DXA ordered due to recent wrist fracture Labs and immunizations with her primary Encouraged safe sexual practices as indicated Encouraged healthy lifestyle practices with diet and exercise For patients under 50-61yo, I recommend 1200mg  calcium daily and 600IU of vitamin D daily.

## 2023-08-18 NOTE — Patient Instructions (Signed)
For patients under 50-61yo, I recommend 1200mg  calcium daily and 600IU of vitamin D daily. For patients over 61yo, I recommend 1200mg  calcium daily and 800IU of vitamin D daily.  Health Maintenance, Female Adopting a healthy lifestyle and getting preventive care are important in promoting health and wellness. Ask your health care provider about: The right schedule for you to have regular tests and exams. Things you can do on your own to prevent diseases and keep yourself healthy. What should I know about diet, weight, and exercise? Eat a healthy diet  Eat a diet that includes plenty of vegetables, fruits, low-fat dairy products, and lean protein. Do not eat a lot of foods that are high in solid fats, added sugars, or sodium. Maintain a healthy weight Body mass index (BMI) is used to identify weight problems. It estimates body fat based on height and weight. Your health care provider can help determine your BMI and help you achieve or maintain a healthy weight. Get regular exercise Get regular exercise. This is one of the most important things you can do for your health. Most adults should: Exercise for at least 150 minutes each week. The exercise should increase your heart rate and make you sweat (moderate-intensity exercise). Do strengthening exercises at least twice a week. This is in addition to the moderate-intensity exercise. Spend less time sitting. Even light physical activity can be beneficial. Watch cholesterol and blood lipids Have your blood tested for lipids and cholesterol at 61 years of age, then have this test every 5 years. Have your cholesterol levels checked more often if: Your lipid or cholesterol levels are high. You are older than 61 years of age. You are at high risk for heart disease. What should I know about cancer screening? Depending on your health history and family history, you may need to have cancer screening at various ages. This may include screening  for: Breast cancer. Cervical cancer. Colorectal cancer. Skin cancer. Lung cancer. What should I know about heart disease, diabetes, and high blood pressure? Blood pressure and heart disease High blood pressure causes heart disease and increases the risk of stroke. This is more likely to develop in people who have high blood pressure readings or are overweight. Have your blood pressure checked: Every 3-5 years if you are 83-61 years of age. Every year if you are 4 years old or older. Diabetes Have regular diabetes screenings. This checks your fasting blood sugar level. Have the screening done: Once every three years after age 68 if you are at a normal weight and have a low risk for diabetes. More often and at a younger age if you are overweight or have a high risk for diabetes. What should I know about preventing infection? Hepatitis B If you have a higher risk for hepatitis B, you should be screened for this virus. Talk with your health care provider to find out if you are at risk for hepatitis B infection. Hepatitis C Testing is recommended for: Everyone born from [birth year] through 1963. Anyone with known risk factors for hepatitis C. Sexually transmitted infections (STIs) Get screened for STIs, including gonorrhea and chlamydia, if: You are sexually active and are younger than 61 years of age. You are older than 61 years of age and your health care provider tells you that you are at risk for this type of infection. Your sexual activity has changed since you were last screened, and you are at increased risk for chlamydia or gonorrhea. Ask your health care provider if  you are at risk. Ask your health care provider about whether you are at high risk for HIV. Your health care provider may recommend a prescription medicine to help prevent HIV infection. If you choose to take medicine to prevent HIV, you should first get tested for HIV. You should then be tested every 3 months for as long as you  are taking the medicine. Osteoporosis and menopause Osteoporosis is a disease in which the bones lose minerals and strength with aging. This can result in bone fractures. If you are 44 years old or older, or if you are at risk for osteoporosis and fractures, ask your health care provider if you should: Be screened for bone loss. Take a calcium or vitamin D supplement to lower your risk of fractures. Be given hormone replacement therapy (HRT) to treat symptoms of menopause. Follow these instructions at home: Alcohol use Do not drink alcohol if: Your health care provider tells you not to drink. You are pregnant, may be pregnant, or are planning to become pregnant. If you drink alcohol: Limit how much you have to: 0-1 drink a day. Know how much alcohol is in your drink. In the [place] one drink equals one 12 oz bottle of beer (355 mL), one 5 oz glass of wine (148 mL), or one 1 oz glass of hard liquor (44 mL). Lifestyle Do not use any products that contain nicotine or tobacco. These products include cigarettes, chewing tobacco, and vaping devices, such as e-cigarettes. If you need help quitting, ask your health care provider. Do not use street drugs. Do not share needles. Ask your health care provider for help if you need support or information about quitting drugs. General instructions Schedule regular health, dental, and eye exams. Stay current with your vaccines. Tell your health care provider if: You often feel depressed. You have ever been abused or do not feel safe at home. Summary Adopting a healthy lifestyle and getting preventive care are important in promoting health and wellness. Follow your health care provider's instructions about healthy diet, exercising, and getting tested or screened for diseases. Follow your health care provider's instructions on monitoring your cholesterol and blood pressure. This information is not intended to replace advice given to you by your health  care provider. Make sure you discuss any questions you have with your health care provider. Document Revised: 12/08/2020 Document Reviewed: 12/08/2020 Elsevier Patient Education  2024 [employer].

## 2023-08-18 NOTE — Assessment & Plan Note (Addendum)
Patient has not yet started Veozah due to recent wrist fracture and surgical repair. She is planning to start the beginning of February. Baseline CMP October 2024, within normal limits. CMP ordered for May.

## 2023-08-18 NOTE — Assessment & Plan Note (Signed)
Recent right wrist fracture.  Recommend bone density screening.

## 2023-08-18 NOTE — Progress Notes (Signed)
63 y.o. [id number] postmenopausal female with vasomotor symptoms on Veozah, HSV here for annual exam. Married.  Granddaughter born the beginning of January!  However her daughter had to have a cesarean hysterectomy.  Recent right wrist fracture September 2024. Interested in bone scan. Pt reports vaginal irritations off and on, vaginal odor in the afternoons   Postmenopausal bleeding: None Pelvic discharge or pain: None Breast mass, nipple discharge or skin changes : None Last PAP:     Component Value Date/Time   DIAGPAP  05/20/2021 1555    - Negative for intraepithelial lesion or malignancy (NILM)   ADEQPAP  05/20/2021 1555    Satisfactory for evaluation; transformation zone component PRESENT.   Last mammogram: 04/27/2023 BI-RADS 1, density B Last colonoscopy: May 2022 Last DXA: Never Sexually active: Yes Exercising:  Yes, running, walking, yoga, interval training  Smoker:no  GYN HISTORY: LEEP 1997 for LGSIL with normal Pap smears since.  Left breast biopsy 02-10-22: Patho benign, fibroadenoma and fibrocystic disease.  OB History  Gravida Para Term Preterm AB Living  3 2 2  1 2   SAB IAB Ectopic Multiple Live Births  1        # Outcome Date GA Lbr Len/2nd Weight Sex Type Anes PTL Lv  3 SAB           2 Term           1 Term             Past Medical History:  Diagnosis Date   Asthma    EXERCISE INDUCED   Cervical dysplasia 1997   Complication of anesthesia    Heart murmur    HSV-2 infection    Migraines    PONV (postoperative nausea and vomiting)    Stress fracture    UPJ (ureteropelvic junction) obstruction    Ureteropelvic junction obstruction    LEFT    Past Surgical History:  Procedure Laterality Date   CERVICAL BIOPSY  W/ LOOP ELECTRODE EXCISION  1997   lgsil   CYSTOSCOPY W/ RETROGRADES Left 05/16/2015   Procedure: CYSTOSCOPY WITH LEFT RETROGRADE PYELOGRAM, STENT PLACEMENT;  Surgeon: Crist Fat, MD;  Location: [facility];  Service: Urology;   Laterality: Left;   Mirena     Inserted 06/11/2014   ROBOT ASSISTED PYELOPLASTY Left 05/16/2015   Procedure: ROBOTIC ASSISTED LAPAROSCOPIC LEFT PYELOPLASTY;  Surgeon: Crist Fat, MD;  Location: [facility];  Service: Urology;  Laterality: Left;   TMJ ARTHROSCOPY  1991   WRIST FRACTURE SURGERY Right 06/2023    Current Outpatient Medications on File Prior to Visit  Medication Sig Dispense Refill   acetaminophen (TYLENOL) 500 MG tablet Take 500-1,000 mg by mouth every 6 (six) hours as needed for headache.     CALCIUM PO Take by mouth.     loratadine (CLARITIN) 10 MG tablet Take 10 mg by mouth daily.     montelukast (SINGULAIR) 10 MG tablet Take 10 mg by mouth at bedtime.     PROVENTIL HFA 108 (90 BASE) MCG/ACT inhaler Inhale 2 puffs into the lungs every 4 (four) hours as needed for wheezing.      SYMBICORT 160-4.5 MCG/ACT inhaler Inhale 2 puffs into the lungs 2 (two) times daily.   10   No current facility-administered medications on file prior to visit.    Social History   Socioeconomic History   Marital status: Married    Spouse name: Not on file   Number of children: 2   Years of  education: Not on file   Highest education level: Not on file  Occupational History   Occupation: [employer]  Tobacco Use   Smoking status: Never   Smokeless tobacco: Never  Vaping Use   Vaping status: Never Used  Substance and Sexual Activity   Alcohol use: No    Alcohol/week: 0.0 standard drinks of alcohol   Drug use: No   Sexual activity: Yes    Birth control/protection: Post-menopausal    Comment: 1st intercourse 63 yo-Fewer than 5 partners  Other Topics Concern   Not on file  Social History Narrative   Not on file   Social Drivers of Health   Financial Resource Strain: Not on file  Food Insecurity: Not on file  Transportation Needs: Not on file  Physical Activity: Not on file  Stress: Not on file  Social Connections: Not on file  Intimate Partner Violence: Not on  file    Family History  Problem Relation Age of Onset   Thyroid disease Mother    Heart failure Father     Allergies  Allergen Reactions   Codeine Nausea And Vomiting   Other Other (See Comments)      PE Today's Vitals   08/18/23 1203  BP: (!) 156/78  Pulse: 82  SpO2: 98%  Weight: 139 lb (63 kg)  Height: 5' 4.17" (1.63 m)   Body mass index is 23.73 kg/m.  Physical Exam Vitals reviewed. Exam conducted with a chaperone present.  Constitutional:      General: She is not in acute distress.    Appearance: Normal appearance.  HENT:     Head: Normocephalic and atraumatic.     Nose: Nose normal.  Eyes:     Extraocular Movements: Extraocular movements intact.     Conjunctiva/sclera: Conjunctivae normal.  Neck:     Thyroid: No thyroid mass, thyromegaly or thyroid tenderness.  Pulmonary:     Effort: Pulmonary effort is normal.  Chest:     Chest wall: No mass or tenderness.  Breasts:    Right: Normal. No swelling, mass, nipple discharge, skin change or tenderness.     Left: Normal. No swelling, mass, nipple discharge, skin change or tenderness.  Abdominal:     General: There is no distension.     Palpations: Abdomen is soft.     Tenderness: There is no abdominal tenderness.  Genitourinary:    General: Normal vulva.     Exam position: Lithotomy position.     Urethra: No prolapse.     Vagina: Normal. No vaginal discharge or bleeding.     Cervix: Normal. No lesion.     Uterus: Normal. Not enlarged and not tender.      Adnexa: Right adnexa normal and left adnexa normal.  Musculoskeletal:        General: Normal range of motion.     Cervical back: Normal range of motion.  Lymphadenopathy:     Upper Body:     Right upper body: No axillary adenopathy.     Left upper body: No axillary adenopathy.     Lower Body: No right inguinal adenopathy. No left inguinal adenopathy.  Skin:    General: Skin is warm and dry.  Neurological:     General: No focal deficit present.      Mental Status: She is alert.  Psychiatric:        Mood and Affect: Mood normal.        Behavior: Behavior normal.     Assessment and Plan:  Well woman exam with routine gynecological exam Assessment & Plan: Cervical cancer screening performed according to ASCCP guidelines. Encouraged annual mammogram screening Colonoscopy UTD DXA ordered due to recent wrist fracture Labs and immunizations with her primary Encouraged safe sexual practices as indicated Encouraged healthy lifestyle practices with diet and exercise For patients under 50-70yo, I recommend 1200mg  calcium daily and 600IU of vitamin D daily.    Cervical cancer screening -     Cytology - PAP  Vasomotor symptoms due to menopause Assessment & Plan: Patient has not yet started Veozah due to recent wrist fracture and surgical repair. She is planning to start the beginning of February. Baseline CMP October 2024, within normal limits. CMP ordered for May.  Orders: -     COMPLETE METABOLIC PANEL WITH GFR; Future  History of herpes genitalis -     valACYclovir HCl; Take one pill (500 mg) daily for prevention.  Increase to one pill twice a day for three days for an outbreak.  Dispense: 90 tablet; Refill: 1  At risk for osteoporosis Assessment & Plan: Recent right wrist fracture.  Recommend bone density screening.  Orders: -     DG Bone Density; Future  Vaginal discharge -     WET PREP FOR TRICH, YEAST, CLUE  Genitourinary syndrome of menopause -     Estradiol; Apply 1/2 gram to vulva nightly for 2 weeks then decrease to 1/2 gram to vulva two nights a week.  Dispense: 42.5 g; Refill: 1    Rosalyn Gess, MD

## 2023-08-22 LAB — CYTOLOGY - PAP
Comment: NEGATIVE
High risk HPV: NEGATIVE

## 2023-08-24 ENCOUNTER — Other Ambulatory Visit: Payer: Self-pay

## 2023-08-24 DIAGNOSIS — R87618 Other abnormal cytological findings on specimens from cervix uteri: Secondary | ICD-10-CM

## 2023-09-26 ENCOUNTER — Other Ambulatory Visit (HOSPITAL_COMMUNITY)
Admission: RE | Admit: 2023-09-26 | Discharge: 2023-09-26 | Disposition: A | Discharge status: Home / Self Care | Payer: BC Managed Care – PPO | Source: Ambulatory Visit | Attending: Obstetrics and Gynecology | Admitting: Obstetrics and Gynecology

## 2023-09-26 ENCOUNTER — Encounter: Payer: Self-pay | Admitting: Obstetrics and Gynecology

## 2023-09-26 ENCOUNTER — Ambulatory Visit (INDEPENDENT_AMBULATORY_CARE_PROVIDER_SITE_OTHER): Payer: BC Managed Care – PPO | Admitting: Obstetrics and Gynecology

## 2023-09-26 VITALS — BP 138/78 | HR 72 | Wt 139.0 lb

## 2023-09-26 DIAGNOSIS — R87618 Other abnormal cytological findings on specimens from cervix uteri: Secondary | ICD-10-CM | POA: Insufficient documentation

## 2023-09-26 DIAGNOSIS — N952 Postmenopausal atrophic vaginitis: Secondary | ICD-10-CM | POA: Diagnosis not present

## 2023-09-26 DIAGNOSIS — R896 Abnormal cytological findings in specimens from other organs, systems and tissues: Secondary | ICD-10-CM | POA: Diagnosis not present

## 2023-09-26 LAB — URINALYSIS, COMPLETE W/RFL CULTURE
Bacteria, UA: NONE SEEN /[HPF]
Bilirubin Urine: NEGATIVE
Glucose, UA: NEGATIVE
Hgb urine dipstick: NEGATIVE
Hyaline Cast: NONE SEEN /[LPF]
Ketones, ur: NEGATIVE
Leukocyte Esterase: NEGATIVE
Nitrites, Initial: NEGATIVE
Protein, ur: NEGATIVE
RBC / HPF: NONE SEEN /[HPF] (ref 0–2)
Specific Gravity, Urine: 1.004 (ref 1.001–1.035)
WBC, UA: NONE SEEN /[HPF] (ref 0–5)
pH: 6 (ref 5.0–8.0)

## 2023-09-26 LAB — NO CULTURE INDICATED

## 2023-09-26 NOTE — Patient Instructions (Signed)
 It is common to have vaginal bleeding and cramping for up to 72 hours after your biopsy. Please call our office with heavy vaginal bleeding, severe abdominal pain or fever. Avoid intercourse, tampon use, douching and baths for 7 days to decrease the risk of infection.

## 2023-09-26 NOTE — Progress Notes (Signed)
 63 y.o. [id number] female with vasomotor symptoms on Veozah, HSV, GSM (PV estradiol) here for colposcopy.  Married.  Granddaughter born the beginning of January!  However her daughter had to have a cesarean hysterectomy.   Patient's last menstrual period was 05/03/2020. No complaints. Doing well. Using vaginal estrogen.  Last PAP:    Component Value Date/Time   DIAGPAP - Atrophic pattern with epithelial atypia (A) 08/18/2023 1224   DIAGPAP  05/20/2021 1555    - Negative for intraepithelial lesion or malignancy (NILM)   HPVHIGH Negative 08/18/2023 1224   ADEQPAP  08/18/2023 1224    Satisfactory for evaluation; transformation zone component PRESENT.   ADEQPAP  05/20/2021 1555    Satisfactory for evaluation; transformation zone component PRESENT.   GYN HISTORY: LEEP 1997 for LGSIL with normal Pap smears since.  Left breast biopsy 02-10-22: Patho benign, fibroadenoma and fibrocystic disease.  OB History  Gravida Para Term Preterm AB Living  3 2 2  1 2   SAB IAB Ectopic Multiple Live Births  1        # Outcome Date GA Lbr Len/2nd Weight Sex Type Anes PTL Lv  3 SAB           2 Term           1 Term             Past Medical History:  Diagnosis Date   Asthma    EXERCISE INDUCED   Cervical dysplasia 1997   Complication of anesthesia    Heart murmur    HSV-2 infection    Migraines    PONV (postoperative nausea and vomiting)    Stress fracture    UPJ (ureteropelvic junction) obstruction    Ureteropelvic junction obstruction    LEFT    Past Surgical History:  Procedure Laterality Date   CERVICAL BIOPSY  W/ LOOP ELECTRODE EXCISION  1997   lgsil   CYSTOSCOPY W/ RETROGRADES Left 05/16/2015   Procedure: CYSTOSCOPY WITH LEFT RETROGRADE PYELOGRAM, STENT PLACEMENT;  Surgeon: Crist Fat, MD;  Location: [facility];  Service: Urology;  Laterality: Left;   Mirena     Inserted 06/11/2014   ROBOT ASSISTED PYELOPLASTY Left 05/16/2015   Procedure: ROBOTIC ASSISTED LAPAROSCOPIC LEFT  PYELOPLASTY;  Surgeon: Crist Fat, MD;  Location: [facility];  Service: Urology;  Laterality: Left;   TMJ ARTHROSCOPY  1991   WRIST FRACTURE SURGERY Right 06/2023    Current Outpatient Medications on File Prior to Visit  Medication Sig Dispense Refill   acetaminophen (TYLENOL) 500 MG tablet Take 500-1,000 mg by mouth every 6 (six) hours as needed for headache.     CALCIUM PO Take by mouth.     estradiol (ESTRACE VAGINAL) 0.1 MG/GM vaginal cream Apply 1/2 gram to vulva nightly for 2 weeks then decrease to 1/2 gram to vulva two nights a week. 42.5 g 1   loratadine (CLARITIN) 10 MG tablet Take 10 mg by mouth daily.     montelukast (SINGULAIR) 10 MG tablet Take 10 mg by mouth at bedtime.     PROVENTIL HFA 108 (90 BASE) MCG/ACT inhaler Inhale 2 puffs into the lungs every 4 (four) hours as needed for wheezing.      SYMBICORT 160-4.5 MCG/ACT inhaler Inhale 2 puffs into the lungs 2 (two) times daily.   10   valACYclovir (VALTREX) 500 MG tablet Take one pill (500 mg) daily for prevention.  Increase to one pill twice a day for three days for an outbreak. 90 tablet  1   No current facility-administered medications on file prior to visit.    Allergies  Allergen Reactions   Codeine Nausea And Vomiting   Other Other (See Comments)      PE Today's Vitals   09/26/23 1056  BP: 138/78  Pulse: 72  TempSrc: Oral  SpO2: 99%  Weight: 139 lb (63 kg)   Body mass index is 23.73 kg/m.  Physical Exam Vitals reviewed. Exam conducted with a chaperone present.  Constitutional:      General: She is not in acute distress.    Appearance: Normal appearance.  HENT:     Head: Normocephalic and atraumatic.     Nose: Nose normal.  Eyes:     Extraocular Movements: Extraocular movements intact.     Conjunctiva/sclera: Conjunctivae normal.  Pulmonary:     Effort: Pulmonary effort is normal.  Genitourinary:    General: Normal vulva.     Exam position: Lithotomy position.     Vagina: Normal. No vaginal  discharge.     Cervix: Normal. No cervical motion tenderness, discharge or lesion.     Uterus: Normal. Not enlarged and not tender.      Adnexa: Right adnexa normal and left adnexa normal.  Musculoskeletal:        General: Normal range of motion.     Cervical back: Normal range of motion.  Neurological:     General: No focal deficit present.     Mental Status: She is alert.  Psychiatric:        Mood and Affect: Mood normal.        Behavior: Behavior normal.      Colposcopy Procedure Consented for procedure.  Time out performed. Speculum placed in vagina.  Acetic acid 3% was applied to cervix.  Unsatisfactory colposcopy.  TZ not seen. Biopsies taken Yes.   Location(s) - 12:00  Specimens to pathology separately.  Monsel's applied to biopsy areas.   Good hemostasis.  Minimal EBL. No complications.  Tolerated well.     Assessment and Plan:        Other abnormal cytological finding of specimen from cervix -     Surgical pathology -     Surgical pathology  Abnormal PAP results reviewed. ASCCP guidelines reviewed. Consents signed. Unsatisfatory colposcopy, ECC and biopsy performed. Aftercare instructions provided.  Will call with results.   Rosalyn Gess, MD

## 2023-09-29 LAB — SURGICAL PATHOLOGY

## 2023-10-03 ENCOUNTER — Encounter: Payer: Self-pay | Admitting: Obstetrics and Gynecology

## 2023-10-13 ENCOUNTER — Encounter: Payer: Self-pay | Admitting: Obstetrics and Gynecology

## 2023-10-25 ENCOUNTER — Encounter (HOSPITAL_BASED_OUTPATIENT_CLINIC_OR_DEPARTMENT_OTHER): Payer: Self-pay | Admitting: Obstetrics and Gynecology

## 2023-10-25 ENCOUNTER — Encounter: Payer: Self-pay | Admitting: Obstetrics and Gynecology

## 2023-10-25 DIAGNOSIS — Z780 Asymptomatic menopausal state: Secondary | ICD-10-CM | POA: Insufficient documentation

## 2024-01-03 ENCOUNTER — Encounter: Payer: Self-pay | Admitting: Family Medicine

## 2024-01-03 ENCOUNTER — Ambulatory Visit: Admitting: Family Medicine

## 2024-01-03 VITALS — BP 138/88 | Ht 64.0 in | Wt 135.0 lb

## 2024-01-03 DIAGNOSIS — M25562 Pain in left knee: Secondary | ICD-10-CM | POA: Diagnosis not present

## 2024-01-03 DIAGNOSIS — M25561 Pain in right knee: Secondary | ICD-10-CM | POA: Diagnosis not present

## 2024-01-03 DIAGNOSIS — M25551 Pain in right hip: Secondary | ICD-10-CM | POA: Diagnosis not present

## 2024-01-03 DIAGNOSIS — G8929 Other chronic pain: Secondary | ICD-10-CM

## 2024-01-03 DIAGNOSIS — M25552 Pain in left hip: Secondary | ICD-10-CM | POA: Diagnosis not present

## 2024-01-03 NOTE — Progress Notes (Unsigned)
 PCP: Faustina Hood, MD  Subjective:   HPI: Patient is a 63 y.o. female here for bilateral knee pain.  Anne Mann states that 6 months ago she began experiencing bilateral knee pain on the outsides of her knees. She describes the pain as an ache that is exacerbated by walking on uneven terrain, sitting, and twisting. Her pain is improved with tylenol , rest, and exercise (bike + elliptical). Two weeks ago she went on a camping trip and afterwards felt her pain was worse prompting her to seek care. She has not had any trauma to the knee, does not have any numbness or tingling, and has not had any swelling or erythema of the joint. She does try to stretch but does not follow any specific routine. She uses shoe orthotics for ankle problems.   Past Medical History:  Diagnosis Date   Asthma    EXERCISE INDUCED   Cervical dysplasia 1997   Complication of anesthesia    Heart murmur    HSV-2 infection    Migraines    PONV (postoperative nausea and vomiting)    Stress fracture    UPJ (ureteropelvic junction) obstruction    Ureteropelvic junction obstruction    LEFT    Current Outpatient Medications on File Prior to Visit  Medication Sig Dispense Refill   acetaminophen  (TYLENOL ) 500 MG tablet Take 500-1,000 mg by mouth every 6 (six) hours as needed for headache.     CALCIUM PO Take by mouth.     estradiol  (ESTRACE  VAGINAL) 0.1 MG/GM vaginal cream Apply 1/2 gram to vulva nightly for 2 weeks then decrease to 1/2 gram to vulva two nights a week. 42.5 g 1   loratadine (CLARITIN) 10 MG tablet Take 10 mg by mouth daily.     montelukast  (SINGULAIR ) 10 MG tablet Take 10 mg by mouth at bedtime.     PROVENTIL  HFA 108 (90 BASE) MCG/ACT inhaler Inhale 2 puffs into the lungs every 4 (four) hours as needed for wheezing.      SYMBICORT  160-4.5 MCG/ACT inhaler Inhale 2 puffs into the lungs 2 (two) times daily.   10   valACYclovir  (VALTREX ) 500 MG tablet Take one pill (500 mg) daily for prevention.  Increase to  one pill twice a day for three days for an outbreak. 90 tablet 1   No current facility-administered medications on file prior to visit.    Past Surgical History:  Procedure Laterality Date   CERVICAL BIOPSY  W/ LOOP ELECTRODE EXCISION  1997   lgsil   CYSTOSCOPY W/ RETROGRADES Left 05/16/2015   Procedure: CYSTOSCOPY WITH LEFT RETROGRADE PYELOGRAM, STENT PLACEMENT;  Surgeon: Andrez Banker, MD;  Location: [facility];  Service: Urology;  Laterality: Left;   Mirena      Inserted 06/11/2014   ROBOT ASSISTED PYELOPLASTY Left 05/16/2015   Procedure: ROBOTIC ASSISTED LAPAROSCOPIC LEFT PYELOPLASTY;  Surgeon: Andrez Banker, MD;  Location: [facility];  Service: Urology;  Laterality: Left;   TMJ ARTHROSCOPY  1991   WRIST FRACTURE SURGERY Right 06/2023    Allergies  Allergen Reactions   Codeine Nausea And Vomiting   Other Other (See Comments)    BP 138/88   Ht 5\' 4"  (1.626 m)   Wt 135 lb (61.2 kg)   LMP 05/03/2020   BMI 23.17 kg/m      05/19/2021    2:26 PM  [facility]  Frequency of aerobic exercise (# of days/week) 7  Average time in minutes 60  Frequency of strengthening activities (#  of days/week) 4        No data to display              Objective:  Physical Exam:  Gen: NAD, comfortable in exam room  MSK: Hips Inspection: no bruises or deformities visible Palpation: mild tenderness along IT band, no tenderness to greater trochanter ROM: full to flexion, extension Strength: 5/5 to flexion, extension, abduction, adduction Special Tests: Ober positive bilaterally, Scour negative  Knees Inspection: genu valgum present, no bruises or deformities visible Palpation: mildly tender along lateral joint lines, no tenderness medially or to patella  ROM: normal to flexion, extension Strength: 5/5 to flexion, extension, abduction, adduction Special Tests: Valgus normal, Varus normal, Lachman negative, posterior drawer negative, McMurray  negative  Feet Inspection: High arches  Assessment & Plan:  Patient is a 63 y.o. female here for 6 months of bilateral knee pain of the lateral knees. Given her lateral knee symptoms, gradual onset, and genu valgus, suspect that her pain is due to knee osteoarthritis versus a ligamentous or meniscal injury. Likely a component of IT Band tightness as well given her positive Ober test.   1. Knee OA - XR knees to evaluate joint spaces - Exercises to strengthen quadriceps, hip abductors ans stretches for IT band - Knee sleeve for support  - Tylenol , ice for symptomatic management - Will consider steroid injection should pain not improve with exercise and XR show OA  [name], MS4 [facility]

## 2024-01-04 ENCOUNTER — Other Ambulatory Visit: Payer: Self-pay | Admitting: Family Medicine

## 2024-01-04 ENCOUNTER — Ambulatory Visit
Admission: RE | Admit: 2024-01-04 | Discharge: 2024-01-04 | Disposition: A | Discharge status: Home / Self Care | Source: Ambulatory Visit | Attending: Family Medicine | Admitting: Family Medicine

## 2024-01-04 DIAGNOSIS — G8929 Other chronic pain: Secondary | ICD-10-CM

## 2024-01-04 DIAGNOSIS — M25551 Pain in right hip: Secondary | ICD-10-CM

## 2024-01-09 ENCOUNTER — Ambulatory Visit: Payer: Self-pay | Admitting: Family Medicine

## 2024-01-09 NOTE — Progress Notes (Signed)
 Xray results reviewed. MyChart message sent.

## 2024-02-08 ENCOUNTER — Ambulatory Visit: Admitting: Obstetrics and Gynecology

## 2024-02-13 ENCOUNTER — Encounter: Payer: Self-pay | Admitting: Obstetrics and Gynecology

## 2024-02-13 ENCOUNTER — Ambulatory Visit: Admitting: Obstetrics and Gynecology

## 2024-02-13 VITALS — BP 120/78 | HR 69 | Temp 98.0°F | Wt 139.0 lb

## 2024-02-13 DIAGNOSIS — N951 Menopausal and female climacteric states: Secondary | ICD-10-CM

## 2024-02-13 DIAGNOSIS — R87618 Other abnormal cytological findings on specimens from cervix uteri: Secondary | ICD-10-CM

## 2024-02-13 DIAGNOSIS — Z124 Encounter for screening for malignant neoplasm of cervix: Secondary | ICD-10-CM

## 2024-02-13 NOTE — Assessment & Plan Note (Signed)
 Oct 2024: Breast cancer risk: Alisa model assessment performed 1.9%, 9.5%, average. CV risk assessment: 3% over next 103yr, average Discussed extended use of HRT in patients with average CV and breast cancer risk. Also discussed nonhormonal options for VMS, including SSRIs, gabapentin and veozah .  After discussion the patient opted to try veozah  again- has supply at home. Veozah  is contraindicated in patient with liver dysfunction and requires q3 months CMP. Side effects include abdominal pain, diarrhea, back pain, and insomnia.  CMP in 3 months

## 2024-02-13 NOTE — Progress Notes (Signed)
 63 y.o. [id number] female with VMS, GSM (PV estradiol ), abnormal PAP with unsatisfactory colposcopy, osteopenia, HSV here for f/u PAP.  Married.  Granddaughter born the beginning of January!  However her daughter had to have a cesarean hysterectomy.   Patient's last menstrual period was 05/03/2020.  Started PV estrace  Jan, doing well on this She wants to discuss hot flashes and other treatments. Has not taken Veozah  since reading the side effects. Has bought the medication. Had recently treated with focused US  for essential tremor. Doing well.  Last PAP:    Component Value Date/Time   DIAGPAP - Atrophic pattern with epithelial atypia (A) 08/18/2023 1224   DIAGPAP  05/20/2021 1555    - Negative for intraepithelial lesion or malignancy (NILM)   HPVHIGH Negative 08/18/2023 1224   ADEQPAP  08/18/2023 1224    Satisfactory for evaluation; transformation zone component PRESENT.   ADEQPAP  05/20/2021 1555    Satisfactory for evaluation; transformation zone component PRESENT.   09/26/23 pathology A. CERVIX, 12 O'CLOCK, BIOPSY:       Squamous mucosa with atrophic changes and reactive epithelial  atypia.      Negative for squamous intraepithelial lesion or malignancy.   B. ENDOCERVIX, CURETTAGE:       Squamous mucosa with atrophic changes and reactive epithelial  atypia.   GYN HISTORY: LEEP 1997 for LGSIL with normal Pap smears since.  Left breast biopsy 02-10-22: Patho benign, fibroadenoma and fibrocystic disease.  OB History  Gravida Para Term Preterm AB Living  3 2 2  1 2   SAB IAB Ectopic Multiple Live Births  1        # Outcome Date GA Lbr Len/2nd Weight Sex Type Anes PTL Lv  3 SAB           2 Term           1 Term             Past Medical History:  Diagnosis Date   Asthma    EXERCISE INDUCED   Cervical dysplasia 1997   Complication of anesthesia    Heart murmur    HSV-2 infection    Migraines    PONV (postoperative nausea and vomiting)    Stress fracture    UPJ  (ureteropelvic junction) obstruction    Ureteropelvic junction obstruction    LEFT    Past Surgical History:  Procedure Laterality Date   CERVICAL BIOPSY  W/ LOOP ELECTRODE EXCISION  1997   lgsil   CYSTOSCOPY W/ RETROGRADES Left 05/16/2015   Procedure: CYSTOSCOPY WITH LEFT RETROGRADE PYELOGRAM, STENT PLACEMENT;  Surgeon: Morene LELON Salines, MD;  Location: [facility];  Service: Urology;  Laterality: Left;   Mirena      Inserted 06/11/2014   ROBOT ASSISTED PYELOPLASTY Left 05/16/2015   Procedure: ROBOTIC ASSISTED LAPAROSCOPIC LEFT PYELOPLASTY;  Surgeon: Morene LELON Salines, MD;  Location: [facility];  Service: Urology;  Laterality: Left;   TMJ ARTHROSCOPY  1991   WRIST FRACTURE SURGERY Right 06/2023    Current Outpatient Medications on File Prior to Visit  Medication Sig Dispense Refill   acetaminophen  (TYLENOL ) 500 MG tablet Take 500-1,000 mg by mouth every 6 (six) hours as needed for headache.     CALCIUM PO Take by mouth.     estradiol  (ESTRACE  VAGINAL) 0.1 MG/GM vaginal cream Apply 1/2 gram to vulva nightly for 2 weeks then decrease to 1/2 gram to vulva two nights a week. 42.5 g 1   loratadine (CLARITIN) 10 MG tablet Take 10 mg  by mouth daily.     magnesium (MAGTAB) 84 MG ( ) TBCR SR tablet Take 84 mg by mouth.     montelukast  (SINGULAIR ) 10 MG tablet Take 10 mg by mouth at bedtime.     PROVENTIL  HFA 108 (90 BASE) MCG/ACT inhaler Inhale 2 puffs into the lungs every 4 (four) hours as needed for wheezing.      SYMBICORT  160-4.5 MCG/ACT inhaler Inhale 2 puffs into the lungs 2 (two) times daily.   10   valACYclovir  (VALTREX ) 500 MG tablet Take one pill (500 mg) daily for prevention.  Increase to one pill twice a day for three days for an outbreak. 90 tablet 1   No current facility-administered medications on file prior to visit.    Allergies  Allergen Reactions   Codeine Nausea And Vomiting   Hydrocodone  Bit-Homatrop Mbr Nausea And Vomiting   Other Other (See Comments)       PE Today's Vitals   02/13/24 1555  BP: 120/78  Pulse: 69  Temp: 98 F (36.7 C)  TempSrc: Oral  SpO2: 99%  Weight: 139 lb (63 kg)   Body mass index is 23.86 kg/m.  Physical Exam Vitals reviewed. Exam conducted with a chaperone present.  Constitutional:      General: She is not in acute distress.    Appearance: Normal appearance.  HENT:     Head: Normocephalic and atraumatic.     Nose: Nose normal.  Eyes:     Extraocular Movements: Extraocular movements intact.     Conjunctiva/sclera: Conjunctivae normal.  Pulmonary:     Effort: Pulmonary effort is normal.  Genitourinary:    General: Normal vulva.     Exam position: Lithotomy position.     Vagina: Normal. No vaginal discharge.     Cervix: Normal. No cervical motion tenderness, discharge or lesion.     Uterus: Normal. Not enlarged and not tender.      Adnexa: Right adnexa normal and left adnexa normal.  Musculoskeletal:        General: Normal range of motion.     Cervical back: Normal range of motion.  Neurological:     General: No focal deficit present.     Mental Status: She is alert.  Psychiatric:        Mood and Affect: Mood normal.        Behavior: Behavior normal.     Assessment and Plan:        Other abnormal cytological finding of specimen from cervix -     Cytology - PAP  Cervical cancer screening -     Cytology - PAP  Vasomotor symptoms due to menopause Assessment & Plan: Oct 2024: Breast cancer risk: Alisa model assessment performed 1.9%, 9.5%, average. CV risk assessment: 3% over next 53yr, average Discussed extended use of HRT in patients with average CV and breast cancer risk. Also discussed nonhormonal options for VMS, including SSRIs, gabapentin and veozah .  After discussion the patient opted to try veozah  again- has supply at home. Veozah  is contraindicated in patient with liver dysfunction and requires q3 months CMP. Side effects include abdominal pain, diarrhea, back pain, and insomnia.   CMP in 3 months  Orders: -     COMPLETE METABOLIC PANEL WITHOUT GFR; Future  RTO for annual  Vera LULLA Pa, MD

## 2024-02-17 LAB — CYTOLOGY - PAP
Comment: NEGATIVE
Diagnosis: NEGATIVE
High risk HPV: NEGATIVE

## 2024-02-21 ENCOUNTER — Ambulatory Visit: Payer: Self-pay | Admitting: Obstetrics and Gynecology

## 2024-04-27 LAB — HM MAMMOGRAPHY

## 2024-04-30 ENCOUNTER — Ambulatory Visit: Payer: Self-pay | Admitting: Obstetrics and Gynecology

## 2024-04-30 ENCOUNTER — Encounter: Payer: Self-pay | Admitting: Obstetrics and Gynecology

## 2024-04-30 DIAGNOSIS — N951 Menopausal and female climacteric states: Secondary | ICD-10-CM

## 2024-04-30 MED ORDER — VEOZAH 45 MG PO TABS: 45.0000 mg | ORAL_TABLET | Freq: Every day | ORAL | 0 refills | Status: AC

## 2024-04-30 NOTE — Telephone Encounter (Signed)
 Pt is asking for a refill on the Veozah  to be sent to [facility], she states that she didn't start taking the medication until July and the rx has expired. Please advise if a refill is ok to be sent in

## 2024-04-30 NOTE — Telephone Encounter (Signed)
 Rx pended. Please confirm if 30 day or 90 day supply

## 2024-08-29 ENCOUNTER — Ambulatory Visit: Admitting: Obstetrics and Gynecology

## 2024-09-10 ENCOUNTER — Ambulatory Visit: Admitting: Obstetrics and Gynecology
